# Patient Record
Sex: Female | Born: 1960 | Race: White | Hispanic: No | Smoking: Never smoker
Health system: Southern US, Community
[De-identification: ages and names within clinical notes are randomized; demographics above are authoritative.]

## PROBLEM LIST (undated history)

## (undated) DIAGNOSIS — M1990 Unspecified osteoarthritis, unspecified site: Secondary | ICD-10-CM

## (undated) DIAGNOSIS — E785 Hyperlipidemia, unspecified: Secondary | ICD-10-CM

## (undated) DIAGNOSIS — T7840XA Allergy, unspecified, initial encounter: Secondary | ICD-10-CM

## (undated) DIAGNOSIS — M545 Low back pain, unspecified: Secondary | ICD-10-CM

## (undated) DIAGNOSIS — G20A1 Parkinson's disease without dyskinesia, without mention of fluctuations: Secondary | ICD-10-CM

## (undated) DIAGNOSIS — D219 Benign neoplasm of connective and other soft tissue, unspecified: Secondary | ICD-10-CM

## (undated) HISTORY — DX: Benign neoplasm of connective and other soft tissue, unspecified: D21.9

## (undated) HISTORY — PX: WISDOM TOOTH EXTRACTION: SHX21

## (undated) HISTORY — PX: CHOLECYSTECTOMY: SHX55

## (undated) HISTORY — PX: UTERINE FIBROID SURGERY: SHX826

## (undated) HISTORY — DX: Low back pain, unspecified: M54.50

## (undated) HISTORY — DX: Allergy, unspecified, initial encounter: T78.40XA

## (undated) HISTORY — PX: COLONOSCOPY: SHX174

## (undated) HISTORY — PX: ROTATOR CUFF REPAIR: SHX139

## (undated) HISTORY — DX: Parkinson's disease without dyskinesia, without mention of fluctuations: G20.A1

## (undated) HISTORY — DX: Hyperlipidemia, unspecified: E78.5

## (undated) HISTORY — DX: Unspecified osteoarthritis, unspecified site: M19.90

---

## 1996-04-20 HISTORY — PX: CHOLECYSTECTOMY: SHX55

## 2001-08-17 ENCOUNTER — Other Ambulatory Visit: Admission: RE | Admit: 2001-08-17 | Discharge: 2001-08-17 | Payer: Self-pay | Admitting: Obstetrics and Gynecology

## 2002-08-25 ENCOUNTER — Other Ambulatory Visit: Admission: RE | Admit: 2002-08-25 | Discharge: 2002-08-25 | Payer: Self-pay | Admitting: Obstetrics and Gynecology

## 2003-08-30 ENCOUNTER — Other Ambulatory Visit: Admission: RE | Admit: 2003-08-30 | Discharge: 2003-08-30 | Payer: Self-pay | Admitting: Obstetrics and Gynecology

## 2004-04-04 ENCOUNTER — Ambulatory Visit (HOSPITAL_COMMUNITY): Admission: RE | Admit: 2004-04-04 | Discharge: 2004-04-04 | Payer: Self-pay | Admitting: Obstetrics and Gynecology

## 2004-04-04 ENCOUNTER — Encounter (INDEPENDENT_AMBULATORY_CARE_PROVIDER_SITE_OTHER): Payer: Self-pay | Admitting: *Deleted

## 2004-05-20 ENCOUNTER — Ambulatory Visit (HOSPITAL_COMMUNITY): Admission: RE | Admit: 2004-05-20 | Discharge: 2004-05-20 | Payer: Self-pay | Admitting: Orthopedic Surgery

## 2004-09-10 ENCOUNTER — Other Ambulatory Visit: Admission: RE | Admit: 2004-09-10 | Discharge: 2004-09-10 | Payer: Self-pay | Admitting: Obstetrics and Gynecology

## 2005-09-17 ENCOUNTER — Other Ambulatory Visit: Admission: RE | Admit: 2005-09-17 | Discharge: 2005-09-17 | Payer: Self-pay | Admitting: Obstetrics and Gynecology

## 2006-04-20 HISTORY — PX: NOVASURE ABLATION: SHX5394

## 2007-01-19 ENCOUNTER — Encounter (INDEPENDENT_AMBULATORY_CARE_PROVIDER_SITE_OTHER): Payer: Self-pay | Admitting: Obstetrics and Gynecology

## 2007-01-19 ENCOUNTER — Ambulatory Visit (HOSPITAL_COMMUNITY): Admission: RE | Admit: 2007-01-19 | Discharge: 2007-01-19 | Payer: Self-pay | Admitting: Obstetrics and Gynecology

## 2009-04-20 HISTORY — PX: POLYPECTOMY: SHX149

## 2009-06-28 ENCOUNTER — Ambulatory Visit (HOSPITAL_COMMUNITY): Admission: RE | Admit: 2009-06-28 | Discharge: 2009-06-28 | Payer: Self-pay | Admitting: Obstetrics and Gynecology

## 2010-04-20 HISTORY — PX: NOVASURE ABLATION: SHX5394

## 2010-04-20 HISTORY — PX: UTERINE FIBROID SURGERY: SHX826

## 2010-07-13 LAB — CBC
HCT: 37.6 % (ref 36.0–46.0)
MCHC: 33.7 g/dL (ref 30.0–36.0)
Platelets: 256 10*3/uL (ref 150–400)
RDW: 12.2 % (ref 11.5–15.5)

## 2010-09-02 NOTE — Op Note (Signed)
Martha Phillips, Martha Phillips                ACCOUNT NO.:  [id number]   MEDICAL RECORD NO.:  [id number]          PATIENT TYPE:  AMB   LOCATION:  SDC                           FACILITY:  [facility]   PHYSICIAN:  Dois Davenport A. Rivard, M.D. DATE OF BIRTH:  05-28-1960   DATE OF PROCEDURE:  01/19/2007  DATE OF DISCHARGE:                               OPERATIVE REPORT   PREOPERATIVE DIAGNOSIS:  Dysfunctional uterine bleeding with possible  submucosal fibroid.   POSTOPERATIVE DIAGNOSIS:  Dysfunctional uterine bleeding with  endometrial polyp.   ANESTHESIA:  IV sedation and paracervical block.   PROCEDURE:  1. Hysteroscopy.  2. D&C.  3. Novasure endometrial ablation.   SURGEON:  Dr. Dois Davenport A. Rivard, M.D.   ASSISTANT:  None.   Estimated.   BLOOD LOSS:  Minimal.   PROCEDURE:  After being informed of the planned procedure with possible  complications including bleeding, infection and injury to uterus and  possibly subsequent intra-abdominal organ, informed consent is obtained.  The patient is taken to OR #8, given IV sedation, placed in the  lithotomy position and prepped and draped in a sterile fashion.  Her  bladder is emptied with an in-and-out Foley catheter.   GYN exam reveals an anteverted uterus, normal in size and shape, mobile  and 2 normal adnexa.  A weighted speculum is inserted.  Anterior lip of  the cervix was grasped with a tenaculum forceps, and we perform a  paracervical block using lidocaine 1% 16 mL in the usual fashion.  Uterus is sounded at 8 cm; cervical length is 3 cm.  The cervix was then  dilated using Hegar dilator until #29, which allows easy entry of a  diagnostic hysteroscope.  With LR infusion at a maximum pressure of 90  mmHg, we are able to visualize the endometrial cavity and 2 tubal ostia.  We note a left cornual polyp measuring 0.5 cm, and a posterior lower  uterine segment polyp measuring about the same.   The diagnostic hysteroscope was then removed and the  Novasure is  inserted easily to the fundus.  It is deployed and the cavity width is  measured at 3.3 cm.  After assessing integrity of the cavity, which was  passed, we fired the Novasure for endometrial ablation at a power of 91  for a 1-minute procedure time.  The Novasure is then retracted and  removed.     Please before inserting the Novasure, we proceed with a sharp  curettage of the endometrial cavity, removing a moderate amount of  normal-appearing endometrium with the polyps.   After the endometrial ablation is completed, we reinserted the  diagnostic hysteroscope, and note a completely blanched endometrial  cavity.  All instruments are then removed.  Instruments and sponge count  is complete x2.  Estimated blood loss is minimal.  Water deficit is 200  mL.   The cervix has a small laceration on its anterior lip, which was  repaired with a figure-of-eight stitch of 2-0 chromic.   Instrument and sponge count is complete x2.  The procedure is very well  tolerated by the patient,  who is taken to recovery room and discharged  home in a well and stable condition.   Specimen: endometrial curettings sent to pathology   Silverio Lay, MD      Crist Fat. Rivard, M.D.  Electronically Signed     SAR/MEDQ  D:  01/19/2007  T:  01/19/2007  Job:  [id number]

## 2010-09-05 NOTE — Op Note (Signed)
Martha Phillips, Martha Phillips                ACCOUNT NO.:  [id number]   MEDICAL RECORD NO.:  [id number]          PATIENT TYPE:  AMB   LOCATION:  SDC                           FACILITY:  [facility]   PHYSICIAN:  Osborn Coho, M.D.   DATE OF BIRTH:  1961/01/04   DATE OF PROCEDURE:  04/04/2004  DATE OF DISCHARGE:                                 OPERATIVE REPORT   PREOPERATIVE DIAGNOSES:  1.  Metrorrhagia.  2.  Posterior submucosal fibroid.   POSTOPERATIVE DIAGNOSES:  1.  Metrorrhagia.  2.  Posterior submucosal fibroid.   PROCEDURE:  Hysteroscopy, D&C, resection of fibroid.   ANESTHESIA:  General.   ATTENDING PHYSICIAN:  Osborn Coho, M.D.   FLUIDS:  1500 mL   Hysteroscopic fluid deficit  520 mL of sorbitol.   ESTIMATED BLOOD LOSS:  Minimal.   URINE OUTPUT:  Straight cath prior to procedure.   COMPLICATIONS:  None.   FINDINGS:  Large posterior submucosal fibroid.   PATHOLOGY:  Portions of resected fibroid and endometrial curettings.   DESCRIPTION OF PROCEDURE:  The patient was taken to the operating room after  consent signed and witnessed. The patient was placed under general  anesthesia and prepped and draped in the normal sterile fashion in the  dorsal lithotomy position. A bivalve speculum was placed in the patient's  vagina and a paracervical block administered with a total of 10 mL of 1%  lidocaine. A single tooth tenaculum was placed on the anterior lip of the  cervix and the cervix dilated for passage of the diagnostic hysteroscope.  The diagnostic hysteroscope was introduced and large posterior submucosal  fibroid noted.  The cervix was then dilated for passage of the resectoscope.  The  resectoscope was introduced and resection of fibroid performed. Curettage  was performed after resection complete.  Instruments were removed. Sponge  and lap count were correct.  The tenaculum was removed and there was  hemostasis noted at the tenaculum site. There was minimal bleeding  noted.     [name]   AR/MEDQ  D:  04/04/2004  T:  04/05/2004  Job:  [id number]

## 2011-01-29 LAB — CBC
MCHC: 34.3
MCV: 89.6
Platelets: 349
RBC: 4.43
RDW: 12.4

## 2011-01-29 LAB — PREGNANCY, URINE: Preg Test, Ur: NEGATIVE

## 2011-09-21 ENCOUNTER — Encounter: Payer: Self-pay | Admitting: Internal Medicine

## 2011-09-29 ENCOUNTER — Encounter: Payer: Self-pay | Admitting: Obstetrics and Gynecology

## 2011-09-29 ENCOUNTER — Ambulatory Visit: Payer: Self-pay | Admitting: Obstetrics and Gynecology

## 2011-09-29 ENCOUNTER — Ambulatory Visit (INDEPENDENT_AMBULATORY_CARE_PROVIDER_SITE_OTHER): Payer: 59 | Admitting: Obstetrics and Gynecology

## 2011-09-29 VITALS — BP 112/74 | Ht 65.6 in | Wt 186.0 lb

## 2011-09-29 DIAGNOSIS — Z780 Asymptomatic menopausal state: Secondary | ICD-10-CM

## 2011-09-29 DIAGNOSIS — Z01419 Encounter for gynecological examination (general) (routine) without abnormal findings: Secondary | ICD-10-CM

## 2011-09-29 DIAGNOSIS — Z124 Encounter for screening for malignant neoplasm of cervix: Secondary | ICD-10-CM

## 2011-09-29 NOTE — Progress Notes (Signed)
The patient reports:normal menses, no abnormal bleeding, pelvic pain or discharge  Contraception:oral contraceptives (estrogen/progesterone)lo- estrin 24   Last mammogram: No results  Yet  September 25 2011 Last pap: was normal June  2012  GC/Chlamydia cultures offered: declined HIV/RPR/HbsAg offered:  declined HSV 1 and 2 glycoprotein offered: declined  Menstrual cycle regular and monthly: No: Birth Control Menstrual flow normal: No: Birth control  Urinary symptoms: none Normal bowel movements: Yes Reports abuse at home:No  Subjective:    Martha Phillips is a 51 y.o. female, G0P0, who presents for an annual exam. Loestrin 24 continuous: amenorrhea s/p ablation.   History   Social History  . Marital Status: Divorced    Spouse Name: N/A    Number of Children: N/A  . Years of Education: N/A   Social History Main Topics  . Smoking status: Never Smoker   . Smokeless tobacco: Never Used  . Alcohol Use: No  . Drug Use: No  . Sexually Active: No     Loestrin 24 cont.  cycle control   Other Topics Concern  . None   Social History Narrative  . None    Menstrual cycle:   LMP: No LMP recorded. Patient is not currently having periods (Reason: Oral contraceptives).           Cycle:none  The following portions of the patient's history were reviewed and updated as appropriate: allergies, current medications, past family history, past medical history, past social history, past surgical history and problem list.  Review of Systems Pertinent items are noted in HPI. Breast:Negative for breast lump,nipple discharge or nipple retraction Gastrointestinal: Negative for abdominal pain, change in bowel habits or rectal bleeding Urinary:negative   Objective:    BP 112/74  Ht 5' 5.6" (1.666 m)  Wt 186 lb (84.369 kg)  BMI 30.39 kg/m2    Weight:  Wt Readings from Last 1 Encounters:  09/29/11 186 lb (84.369 kg)          BMI: Body mass index is 30.39 kg/(m^2).  General Appearance: Alert,  appropriate appearance for age. No acute distress HEENT: Grossly normal Neck / Thyroid: Supple, no masses, nodes or enlargement Lungs: clear to auscultation bilaterally Back: No CVA tenderness Breast Exam: No masses or nodes.No dimpling, nipple retraction or discharge. Cardiovascular: Regular rate and rhythm. S1, S2, no murmur Gastrointestinal: Soft, non-tender, no masses or organomegaly Pelvic Exam: Vulva and vagina appear normal. Bimanual exam reveals normal uterus and adnexa. Rectovaginal: normal rectal, no masses Lymphatic Exam: Non-palpable nodes in neck, clavicular, axillary, or inguinal regions Skin: no rash or abnormalities Neurologic: Normal gait and speech, no tremor  Psychiatric: Alert and oriented, appropriate affect.   Wet Prep:not applicable Urinalysis:not applicable UPT: Not done   Assessment:    Normal gyn exam    Plan:    return annually or prn STD screening: declined Contraception:oral contraceptives (estrogen/progesterone)      Stacie Templin AMD

## 2011-09-30 LAB — PAP IG W/ RFLX HPV ASCU

## 2011-10-01 ENCOUNTER — Telehealth: Payer: Self-pay | Admitting: Obstetrics and Gynecology

## 2011-10-01 NOTE — Telephone Encounter (Signed)
Laura/epic °

## 2011-10-16 ENCOUNTER — Other Ambulatory Visit: Payer: 59

## 2011-10-16 DIAGNOSIS — N951 Menopausal and female climacteric states: Secondary | ICD-10-CM

## 2011-10-17 LAB — PROLACTIN: Prolactin: 7.5 ng/mL

## 2011-10-27 ENCOUNTER — Other Ambulatory Visit: Payer: Self-pay | Admitting: Obstetrics and Gynecology

## 2011-10-27 NOTE — Telephone Encounter (Signed)
sr pt 

## 2011-11-06 ENCOUNTER — Encounter: Payer: Self-pay | Admitting: Internal Medicine

## 2011-11-06 ENCOUNTER — Ambulatory Visit (AMBULATORY_SURGERY_CENTER): Payer: 59 | Admitting: *Deleted

## 2011-11-06 VITALS — Ht 66.0 in | Wt 192.0 lb

## 2011-11-06 DIAGNOSIS — Z1211 Encounter for screening for malignant neoplasm of colon: Secondary | ICD-10-CM

## 2011-11-06 MED ORDER — MOVIPREP 100 G PO SOLR: ORAL | Status: DC

## 2011-11-10 ENCOUNTER — Other Ambulatory Visit: Payer: Self-pay

## 2011-11-10 ENCOUNTER — Other Ambulatory Visit: Payer: Self-pay | Admitting: Obstetrics and Gynecology

## 2011-11-10 ENCOUNTER — Telehealth: Payer: Self-pay | Admitting: Obstetrics and Gynecology

## 2011-11-10 NOTE — Telephone Encounter (Signed)
Approved rx refill for pt. Called into [facility] loestrine 24 Dips 3 packs sig 1 po qd with 1 year refill. Pt aware of rx refill and voice understanding. bt cma

## 2011-11-20 ENCOUNTER — Encounter: Payer: Self-pay | Admitting: Internal Medicine

## 2011-11-20 ENCOUNTER — Ambulatory Visit (AMBULATORY_SURGERY_CENTER): Payer: 59 | Admitting: Internal Medicine

## 2011-11-20 VITALS — BP 124/75 | HR 63 | Temp 99.2°F | Resp 14 | Ht 66.0 in | Wt 192.0 lb

## 2011-11-20 DIAGNOSIS — Z1211 Encounter for screening for malignant neoplasm of colon: Secondary | ICD-10-CM

## 2011-11-20 MED ORDER — SODIUM CHLORIDE 0.9 % IV SOLN: 500.0000 mL | INTRAVENOUS | Status: DC

## 2011-11-20 NOTE — Op Note (Signed)
[facility] [address] [place], [place]  [address]  COLONOSCOPY PROCEDURE REPORT  PATIENT:  Martha Phillips, Martha Phillips  MR#:  [id number] BIRTHDATE:  1960-12-09, 51 yrs. old  GENDER:  female ENDOSCOPIST:  Hedwig Morton. Juanda Chance, MD REF. BY:  Artis Delay, M.D. PROCEDURE DATE:  11/20/2011 PROCEDURE:  Colonoscopy 19147 ASA CLASS:  Class I INDICATIONS:  colorectal cancer screening, average risk MEDICATIONS:   MAC sedation, administered by CRNA, propofol (Diprivan) 350 mg  DESCRIPTION OF PROCEDURE:   After the risks and benefits and of the procedure were explained, informed consent was obtained. Digital rectal exam was performed and revealed no rectal masses. The LB [id number]-[id number] endoscope was introduced through the anus and advanced to the cecum, which was identified by both the appendix and ileocecal valve.  The quality of the prep was excellent, using MoviPrep.  The instrument was then slowly withdrawn as the colon was fully examined. <<PROCEDUREIMAGES>>  FINDINGS:  No polyps or cancers were seen (see image1, image2, image3, and image4).   Retroflexed views in the rectum revealed no abnormalities.    The scope was then withdrawn from the patient and the procedure completed.  COMPLICATIONS:  None ENDOSCOPIC IMPRESSION: 1) No polyps or cancers 2) Normal colonoscopy RECOMMENDATIONS: 1) High fiber diet.  REPEAT EXAM:  In 10 year(s) for.  ______________________________ Hedwig Morton. Juanda Chance, MD  CC:  n. eSIGNED:   Hedwig Morton. Lourene Hoston at 11/20/2011 09:25 AM  Gypsy Balsam, [phone]

## 2011-11-20 NOTE — Progress Notes (Signed)
Patient did not experience any of the following events: a burn prior to discharge; a fall within the facility; wrong site/side/patient/procedure/implant event; or a hospital transfer or hospital admission upon discharge from the facility. (G8907) Patient did not have preoperative order for IV antibiotic SSI prophylaxis. (G8918) Patient did not have preoperative order for IV antibiotic SSI prophylaxis. (G8918)  

## 2011-11-20 NOTE — Patient Instructions (Addendum)

## 2011-11-23 ENCOUNTER — Telehealth: Payer: Self-pay

## 2011-11-23 NOTE — Telephone Encounter (Signed)
  Follow up Call-  Call back number 11/20/2011  Post procedure Call Back phone  # [phone]  Permission to leave phone message No  comments no voicemail     Patient questions:  Do you have a fever, pain , or abdominal swelling? no Pain Score  0 *  Have you tolerated food without any problems? yes  Have you been able to return to your normal activities? yes  Do you have any questions about your discharge instructions: Diet   no Medications  no Follow up visit  no  Do you have questions or concerns about your Care? no  Actions: * If pain score is 4 or above: No action needed, pain <4.

## 2012-06-11 ENCOUNTER — Other Ambulatory Visit: Payer: Self-pay | Admitting: Obstetrics and Gynecology

## 2013-01-28 ENCOUNTER — Emergency Department (INDEPENDENT_AMBULATORY_CARE_PROVIDER_SITE_OTHER)
Admission: EM | Admit: 2013-01-28 | Discharge: 2013-01-28 | Disposition: A | Discharge status: Home / Self Care | Payer: 59 | Source: Home / Self Care | Attending: Family Medicine | Admitting: Family Medicine

## 2013-01-28 ENCOUNTER — Encounter (HOSPITAL_COMMUNITY): Payer: Self-pay | Admitting: Emergency Medicine

## 2013-01-28 DIAGNOSIS — S61209A Unspecified open wound of unspecified finger without damage to nail, initial encounter: Secondary | ICD-10-CM

## 2013-01-28 DIAGNOSIS — S61012A Laceration without foreign body of left thumb without damage to nail, initial encounter: Secondary | ICD-10-CM

## 2013-01-28 NOTE — ED Notes (Signed)
C/o left hand laceration around the thumb area which happened this morning as patient was cutting vegetables.

## 2013-01-28 NOTE — ED Provider Notes (Signed)
CSN: [id number]     Arrival date & time 01/28/13  4098 History   First MD Initiated Contact with Patient 01/28/13 [phone]     Chief Complaint  Patient presents with  . Laceration   (Consider location/radiation/quality/duration/timing/severity/associated sxs/prior Treatment) Patient is a 52 y.o. female presenting with skin laceration. The history is provided by the patient.  Laceration Location:  Finger Finger laceration location:  L thumb Length (cm):  Approximately 3.5 cm Depth:  Through underlying tissue Quality: straight   Bleeding: controlled   Time since incident:  3 hours Laceration mechanism:  Knife Pain details:    Quality:  Dull   Severity:  Mild Foreign body present:  No foreign bodies Tetanus status:  Up to date (2 years ago.)  The patient presents today with a laceration to proximal inner aspect of L thumb.  The patient states that she was cutting vegetables with a wet knife and the knife slipped and cut her.    Past Medical History  Diagnosis Date  . Fibroid   . Allergy    Past Surgical History  Procedure Laterality Date  . Cholecystectomy    . Novasure ablation  2008  . [place]ine [place]  [place]  [place]    submucosoll resected  . Rotator cuff repair      right  . Polypectomy  2011    Uterus with ablation  . Wisdom tooth extraction     Family History  Problem Relation Age of Onset  . Cancer Sister 5    uterine @ 40 and cervical @30   . Colon cancer Neg Hx   . Stomach cancer Neg Hx    History  Substance Use Topics  . Smoking status: Never Smoker   . Smokeless tobacco: Never Used  . Alcohol Use: No   OB History   Grav Para Term Preterm Abortions TAB SAB Ect Mult Living   0              Review of Systems  Constitutional: Negative.   HENT: Negative.   Eyes: Negative.   Respiratory: Negative.   Cardiovascular: Negative.   Gastrointestinal: Negative.   Endocrine: Negative.   Genitourinary: Negative.   Musculoskeletal: Negative.   Skin:  Positive for wound.  Allergic/Immunologic: Negative.   Neurological: Negative.   Hematological: Negative.   Psychiatric/Behavioral: Negative.     Allergies  Penicillins and Latex  Home Medications   Current Outpatient Rx  Name  Route  Sig  Dispense  Refill  . aspirin (BABY ASPIRIN) 81 MG chewable tablet   Oral   Chew 81 mg by mouth daily.         . cetirizine (ZYRTEC) 10 MG tablet   Oral   Take 10 mg by mouth daily.         . Coenzyme Q10 (CO Q 10) 10 MG CAPS   Oral   Take by mouth.         . LOESTRIN 24 FE 1-20 MG-MCG tablet      TAKE 1 TABLET BY MOUTH DAILY CONTINUOUS   84 tablet   3   . Multiple Vitamin (MULTIVITAMIN) capsule   Oral   Take 1 capsule by mouth daily.         . simvastatin (ZOCOR) 10 MG tablet   Oral   Take 10 mg by mouth at bedtime.          BP 150/82  Pulse 80  Temp(Src) 98.1 F (36.7 C) (Oral)  Resp 16  SpO2  100%  Physical Exam  Nursing note and vitals reviewed. Constitutional: She is oriented to person, place, and time. She appears well-developed and well-nourished. No distress.  Cardiovascular: Normal rate, regular rhythm, normal heart sounds and intact distal pulses.  Exam reveals no gallop and no friction rub.   No murmur heard. Pulmonary/Chest: Effort normal and breath sounds normal. No respiratory distress. She has no wheezes. She has no rales. She exhibits no tenderness.  Musculoskeletal: Normal range of motion. She exhibits no edema and no tenderness.       Left hand: Normal sensation noted.       Hands: Neurological: She is alert and oriented to person, place, and time.  Skin: Skin is warm and dry. No rash noted. She is not diaphoretic. No erythema. No pallor.   Color movement and sensation intact to all distal phalanges.  Patient has full movement and range of motion of the left thumb.   ED Course   LACERATION REPAIR Date/Time: 01/28/2013 10:49 AM Performed by: Weber Cooks Authorized by: Bradd Canary  D Consent: Verbal consent obtained. Risks and benefits: risks, benefits and alternatives were discussed Consent given by: patient Patient understanding: patient states understanding of the procedure being performed Patient identity confirmed: verbally with patient Laceration length: 3.5 cm Foreign bodies: no foreign bodies Tendon involvement: none Nerve involvement: none Vascular damage: no Anesthesia: local infiltration Local anesthetic: lidocaine 2% with epinephrine Anesthetic total: 4 ml Preparation: Patient was prepped and draped in the usual sterile fashion. Irrigation solution: saline Amount of cleaning: standard Skin closure: Ethilon Number of sutures: 3 Technique: horizontal mattress (and 1 simple ) Approximation: close Dressing: 4x4 sterile gauze and antibiotic ointment Patient tolerance: Patient tolerated the procedure well with no immediate complications. Comments: Repair discussed with Dr. Artis Flock prior to closure.  Patient reported good anesthesia during procedure. Easier irrigation of the wound no foreign body or deep structure injury noted.   (including critical care time) Wound edges are well approximated and everted. Labs Review Labs Reviewed - No data to display Imaging Review No results found.   MDM   1. Thumb laceration, left, initial encounter    Patient to return in 7-10 days for removal of sutures. Dressing in place for next 2 days. Patient to remove after that time once granulation has begun. Patient verbalizes understanding of plan of care.    Weber Cooks, NP 01/28/13 1102

## 2013-01-29 NOTE — ED Provider Notes (Signed)
Medical screening examination/treatment/procedure(s) were performed by resident physician or non-physician practitioner and as supervising physician I was immediately available for consultation/collaboration.   KINDL,JAMES DOUGLAS MD.   James D Kindl, MD 01/29/13 1238 

## 2013-02-05 ENCOUNTER — Encounter (HOSPITAL_COMMUNITY): Payer: Self-pay | Admitting: Emergency Medicine

## 2013-02-05 ENCOUNTER — Emergency Department (INDEPENDENT_AMBULATORY_CARE_PROVIDER_SITE_OTHER)
Admission: EM | Admit: 2013-02-05 | Discharge: 2013-02-05 | Disposition: A | Discharge status: Home / Self Care | Payer: 59 | Source: Home / Self Care | Attending: Emergency Medicine | Admitting: Emergency Medicine

## 2013-02-05 DIAGNOSIS — T148XXA Other injury of unspecified body region, initial encounter: Secondary | ICD-10-CM

## 2013-02-05 DIAGNOSIS — IMO0002 Reserved for concepts with insufficient information to code with codable children: Secondary | ICD-10-CM

## 2013-02-05 NOTE — ED Provider Notes (Signed)
Chief Complaint:   Chief Complaint  Patient presents with  . Suture / Staple Removal    History of Present Illness:   Martha Phillips is a 52 year old female who lacerated the web space between her thumb and index finger of her left hand 9 days ago. This was sutured and she returns today for suture removal. She's been taking good care of it and appears to be healing well. There is no evidence of infection.  Review of Systems:  Other than noted above, the patient denies any of the following symptoms: Systemic:  No fever or chills. Musculoskeletal:  No joint pain or decreased range of motion. Neuro:  No numbness, tingling, or weakness.  PMFSH:  Past medical history, family history, social history, meds, and allergies were reviewed.   Physical Exam:   Vital signs:  BP 150/85  Pulse 75  Temp(Src) 98.4 F (36.9 C) (Oral)  Resp 16  SpO2 98% Ext:  There is a 2 cm laceration in the webspace between the thumb and index finger the left hand which has been closed with 4 sutures. This is well-healed with no evidence of infection.  All other joints had a full ROM without pain.  Pulses were full.  Good capillary refill in all digits.  No edema. Neurological:  Alert and oriented.  No muscle weakness.  Sensation was intact to light touch.   Procedure: Verbal informed consent was obtained.  The patient was informed of the risks and benefits of the procedure and understands and accepts.  Identity of the patient was verified verbally and by wristband. The wound was prepped with alcohol and the sutures were snipped out. Patient tolerated this procedure well. She was instructed in wound care.  Assessment:  The encounter diagnosis was Laceration.  Plan:   1.  Meds:  The following meds were prescribed:   Discharge Medication List as of 02/05/2013  9:35 AM      2.  Patient Education/Counseling:  The patient was given appropriate handouts, self care instructions, and instructed in symptomatic relief.  Instructions were given for wound care.   3.  Follow up:  The patient was told to follow up immediately if there is any sign of infection.   Reuben Likes, MD 02/05/13 [phone]

## 2013-04-20 HISTORY — PX: POLYPECTOMY: SHX149

## 2013-06-27 ENCOUNTER — Other Ambulatory Visit: Payer: Self-pay | Admitting: Orthopedic Surgery

## 2013-06-27 DIAGNOSIS — M79659 Pain in unspecified thigh: Secondary | ICD-10-CM

## 2013-06-27 DIAGNOSIS — R102 Pelvic and perineal pain: Secondary | ICD-10-CM

## 2013-06-30 ENCOUNTER — Other Ambulatory Visit: Payer: 59

## 2013-07-06 ENCOUNTER — Ambulatory Visit
Admission: RE | Admit: 2013-07-06 | Discharge: 2013-07-06 | Disposition: A | Discharge status: Home / Self Care | Payer: 59 | Source: Ambulatory Visit | Attending: Orthopedic Surgery | Admitting: Orthopedic Surgery

## 2013-07-06 DIAGNOSIS — R102 Pelvic and perineal pain: Secondary | ICD-10-CM

## 2013-07-06 DIAGNOSIS — M79659 Pain in unspecified thigh: Secondary | ICD-10-CM

## 2013-07-06 MED ORDER — GADOBENATE DIMEGLUMINE 529 MG/ML IV SOLN
18.0000 mL | Freq: Once | INTRAVENOUS | Status: AC | PRN
  Administered 2013-07-06: 18 mL via INTRAVENOUS

## 2013-12-19 ENCOUNTER — Other Ambulatory Visit: Payer: Self-pay | Admitting: Dermatology

## 2014-09-27 ENCOUNTER — Emergency Department (INDEPENDENT_AMBULATORY_CARE_PROVIDER_SITE_OTHER): Payer: 59

## 2014-09-27 ENCOUNTER — Emergency Department (HOSPITAL_COMMUNITY)
Admission: EM | Admit: 2014-09-27 | Discharge: 2014-09-27 | Disposition: A | Discharge status: Home / Self Care | Payer: 59 | Source: Home / Self Care | Attending: Family Medicine | Admitting: Family Medicine

## 2014-09-27 ENCOUNTER — Encounter (HOSPITAL_COMMUNITY): Payer: Self-pay

## 2014-09-27 DIAGNOSIS — S61219A Laceration without foreign body of unspecified finger without damage to nail, initial encounter: Secondary | ICD-10-CM | POA: Diagnosis not present

## 2014-09-27 DIAGNOSIS — S62609B Fracture of unspecified phalanx of unspecified finger, initial encounter for open fracture: Secondary | ICD-10-CM | POA: Diagnosis not present

## 2014-09-27 MED ORDER — HYDROCODONE-ACETAMINOPHEN 5-325 MG PO TABS: 1.0000 | ORAL_TABLET | ORAL | Status: DC | PRN

## 2014-09-27 MED ORDER — SULFAMETHOXAZOLE-TRIMETHOPRIM 800-160 MG PO TABS: 2.0000 | ORAL_TABLET | Freq: Two times a day (BID) | ORAL | Status: DC

## 2014-09-27 NOTE — ED Provider Notes (Signed)
CSN: [id number]     Arrival date & time 09/27/14  1807 History   First MD Initiated Contact with Patient 09/27/14 1901     Chief Complaint  Patient presents with  . 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   Past Medical History  Diagnosis Date  . Fibroid   . Allergy    Past Surgical History  Procedure Laterality Date  . Cholecystectomy    . Novasure ablation  2008  . Uterine fibroid [place]  [place]  [place]    submucosoll resected  . Rotator cuff repair      right  . Polypectomy  2011    Uterus with ablation  . Wisdom tooth extraction     Family History  Problem Relation Age of Onset  . Cancer Sister 30    uterine @ 3 and cervical @30   . Colon cancer Neg Hx   . Stomach cancer Neg Hx    History  Substance Use Topics  . Smoking status: Never Smoker   . Smokeless tobacco: Never Used  . Alcohol Use: No   OB History    Gravida Para Term Preterm AB TAB SAB Ectopic Multiple Living   0              Review of Systems  Constitutional: Negative.   Respiratory: Negative.   Musculoskeletal:       As per history of present illness  Neurological: Negative for dizziness.    Allergies  Penicillins and Latex  Home Medications   Prior to Admission medications   Medication Sig Start Date End Date Taking? Authorizing Provider  escitalopram (LEXAPRO) 10 MG tablet Take 10 mg by mouth daily.   Yes Historical Provider, MD  aspirin (BABY ASPIRIN) 81 MG chewable tablet Chew 81 mg by mouth daily.    Historical Provider, MD  cetirizine (ZYRTEC) 10 MG tablet Take 10 mg by mouth daily.    Historical Provider, MD  Coenzyme Q10 (CO Q 10) 10 MG CAPS Take by mouth.    Historical  Provider, MD  HYDROcodone-acetaminophen (NORCO/VICODIN) 5-325 MG per tablet Take 1 tablet by mouth every 4 (four) hours as needed. 09/27/14   Janne Napoleon, NP  LOESTRIN 24 FE 1-20 MG-MCG tablet TAKE 1 TABLET BY MOUTH DAILY CONTINUOUS 10/27/11   Delsa Bern, MD  Multiple Vitamin (MULTIVITAMIN) capsule Take 1 capsule by mouth daily.    Historical Provider, MD  simvastatin (ZOCOR) 10 MG tablet Take 10 mg by mouth at bedtime.    Historical Provider, MD  sulfamethoxazole-trimethoprim (BACTRIM DS,SEPTRA DS) 800-160 MG per tablet Take 2 tablets by mouth 2 (two) times daily. 09/27/14   Gregor Hams, MD   BP 154/83 mmHg  Pulse 85  Resp 14  SpO2 100% Physical Exam  Constitutional: She is oriented to person, place, and time. She appears well-developed and well-nourished. No distress.  Neck: Normal range of motion. Neck supple.  Cardiovascular: Normal rate.   Pulmonary/Chest: Effort normal. No respiratory distress.  Musculoskeletal:  As per history of present illness. There is a amputation of the left middle finger involving the distal phalanx and the distal fourth of the nail and the tuft of the distal phalanx. No joint affected.   Neurological: She is alert and oriented to  person, place, and time.  Skin: Skin is warm and dry.  Nursing note and vitals reviewed.   ED Course  Procedures (including critical care time) Labs Review Labs Reviewed - No data to display  Imaging Review Dg Finger Middle Left  09/27/2014   CLINICAL DATA:  Stuck middle finger in a lawnmower blade.  EXAM: LEFT MIDDLE FINGER 2+V  COMPARISON:  None.  FINDINGS: There is traumatic amputation of the distal aspect of the third finger. Comminution of the distal phalanx tuft. Soft tissue avulsion. No metallic radiopaque foreign body.  IMPRESSION: Injury to the distal third finger as described.   Electronically Signed   By: Rolla Flatten M.D.   On: 09/27/2014 18:47     MDM   1. Finger laceration, initial encounter   2. Finger  fracture, left, open, initial encounter    Called/page Dr. Lenon Curt [phone]. [phone]. No response by [id number]. Dr. Georgina Snell spoke Dr. Fidela Juneau and arranged F/U tomorrow at 10: 00 AM in the office Septra DS Norco 5 mg #15. Wound cleaning and irrigation. Adaptic, sterile dressing.  Janne Napoleon, NP 09/27/14 2106  Janne Napoleon, NP 09/27/14 2107

## 2014-09-27 NOTE — ED Notes (Addendum)
Reportedly earlier today, reached under lawnmower w left hand, laceration to tip of long finger . Bleeding controlled at present, denies other injury

## 2014-09-27 NOTE — Discharge Instructions (Signed)
Thank you for coming in today. Follow up with Dr. Fredna Dow.  Do not eat or drink anything after midnight.   Laceration Care, Adult A laceration is a cut or lesion that goes through all layers of the skin and into the tissue just beneath the skin. TREATMENT  Some lacerations may not require closure. Some lacerations may not be able to be closed due to an increased risk of infection. It is important to see your caregiver as soon as possible after an injury to minimize the risk of infection and maximize the opportunity for successful closure. If closure is appropriate, pain medicines may be given, if needed. The wound will be cleaned to help prevent infection. Your caregiver will use stitches (sutures), staples, wound glue (adhesive), or skin adhesive strips to repair the laceration. These tools bring the skin edges together to allow for faster healing and a better cosmetic outcome. However, all wounds will heal with a scar. Once the wound has healed, scarring can be minimized by covering the wound with sunscreen during the day for 1 full year. HOME CARE INSTRUCTIONS  For sutures or staples:  Keep the wound clean and dry.  If you were given a bandage (dressing), you should change it at least once a day. Also, change the dressing if it becomes wet or dirty, or as directed by your caregiver.  Wash the wound with soap and water 2 times a day. Rinse the wound off with water to remove all soap. Pat the wound dry with a clean towel.  After cleaning, apply a thin layer of the antibiotic ointment as recommended by your caregiver. This will help prevent infection and keep the dressing from sticking.  You may shower as usual after the first 24 hours. Do not soak the wound in water until the sutures are removed.  Only take over-the-counter or prescription medicines for pain, discomfort, or fever as directed by your caregiver.  Get your sutures or staples removed as directed by your caregiver. For skin  adhesive strips:  Keep the wound clean and dry.  Do not get the skin adhesive strips wet. You may bathe carefully, using caution to keep the wound dry.  If the wound gets wet, pat it dry with a clean towel.  Skin adhesive strips will fall off on their own. You may trim the strips as the wound heals. Do not remove skin adhesive strips that are still stuck to the wound. They will fall off in time. For wound adhesive:  You may briefly wet your wound in the shower or bath. Do not soak or scrub the wound. Do not swim. Avoid periods of heavy perspiration until the skin adhesive has fallen off on its own. After showering or bathing, gently pat the wound dry with a clean towel.  Do not apply liquid medicine, cream medicine, or ointment medicine to your wound while the skin adhesive is in place. This may loosen the film before your wound is healed.  If a dressing is placed over the wound, be careful not to apply tape directly over the skin adhesive. This may cause the adhesive to be pulled off before the wound is healed.  Avoid prolonged exposure to sunlight or tanning lamps while the skin adhesive is in place. Exposure to ultraviolet light in the first year will darken the scar.  The skin adhesive will usually remain in place for 5 to 10 days, then naturally fall off the skin. Do not pick at the adhesive film. You may need  a tetanus shot if:  You cannot remember when you had your last tetanus shot.  You have never had a tetanus shot. If you get a tetanus shot, your arm may swell, get red, and feel warm to the touch. This is common and not a problem. If you need a tetanus shot and you choose not to have one, there is a rare chance of getting tetanus. Sickness from tetanus can be serious. SEEK MEDICAL CARE IF:   You have redness, swelling, or increasing pain in the wound.  You see a red line that goes away from the wound.  You have yellowish-white fluid (pus) coming from the wound.  You have  a fever.  You notice a bad smell coming from the wound or dressing.  Your wound breaks open before or after sutures have been removed.  You notice something coming out of the wound such as wood or glass.  Your wound is on your hand or foot and you cannot move a finger or toe. SEEK IMMEDIATE MEDICAL CARE IF:   Your pain is not controlled with prescribed medicine.  You have severe swelling around the wound causing pain and numbness or a change in color in your arm, hand, leg, or foot.  Your wound splits open and starts bleeding.  You have worsening numbness, weakness, or loss of function of any joint around or beyond the wound.  You develop painful lumps near the wound or on the skin anywhere on your body. MAKE SURE YOU:   Understand these instructions.  Will watch your condition.  Will get help right away if you are not doing well or get worse. Document Released: 04/06/2005 Document Revised: 06/29/2011 Document Reviewed: 09/30/2010 [facility] Patient Information 2015 [place], [place]. This information is not intended to replace advice given to you by your health care provider. Make sure you discuss any questions you have with your health care provider.

## 2014-12-27 ENCOUNTER — Other Ambulatory Visit: Payer: Self-pay | Admitting: Obstetrics and Gynecology

## 2015-04-03 DIAGNOSIS — M79645 Pain in left finger(s): Secondary | ICD-10-CM | POA: Insufficient documentation

## 2015-04-03 DIAGNOSIS — D2112 Benign neoplasm of connective and other soft tissue of left upper limb, including shoulder: Secondary | ICD-10-CM | POA: Insufficient documentation

## 2016-05-25 DIAGNOSIS — L309 Dermatitis, unspecified: Secondary | ICD-10-CM | POA: Diagnosis not present

## 2016-05-25 DIAGNOSIS — R21 Rash and other nonspecific skin eruption: Secondary | ICD-10-CM | POA: Diagnosis not present

## 2016-05-25 DIAGNOSIS — J310 Chronic rhinitis: Secondary | ICD-10-CM | POA: Diagnosis not present

## 2016-06-10 DIAGNOSIS — H40019 Open angle with borderline findings, low risk, unspecified eye: Secondary | ICD-10-CM | POA: Diagnosis not present

## 2016-11-19 DIAGNOSIS — C44519 Basal cell carcinoma of skin of other part of trunk: Secondary | ICD-10-CM | POA: Diagnosis not present

## 2016-11-19 DIAGNOSIS — D485 Neoplasm of uncertain behavior of skin: Secondary | ICD-10-CM | POA: Diagnosis not present

## 2016-11-19 DIAGNOSIS — L905 Scar conditions and fibrosis of skin: Secondary | ICD-10-CM | POA: Diagnosis not present

## 2016-12-02 DIAGNOSIS — C44519 Basal cell carcinoma of skin of other part of trunk: Secondary | ICD-10-CM | POA: Diagnosis not present

## 2016-12-24 DIAGNOSIS — Z86018 Personal history of other benign neoplasm: Secondary | ICD-10-CM | POA: Diagnosis not present

## 2016-12-24 DIAGNOSIS — D1801 Hemangioma of skin and subcutaneous tissue: Secondary | ICD-10-CM | POA: Diagnosis not present

## 2016-12-24 DIAGNOSIS — Z808 Family history of malignant neoplasm of other organs or systems: Secondary | ICD-10-CM | POA: Diagnosis not present

## 2017-02-16 DIAGNOSIS — J019 Acute sinusitis, unspecified: Secondary | ICD-10-CM | POA: Diagnosis not present

## 2017-02-16 DIAGNOSIS — H6993 Unspecified Eustachian tube disorder, bilateral: Secondary | ICD-10-CM | POA: Diagnosis not present

## 2017-02-18 DIAGNOSIS — Z124 Encounter for screening for malignant neoplasm of cervix: Secondary | ICD-10-CM | POA: Diagnosis not present

## 2017-02-18 DIAGNOSIS — Z1231 Encounter for screening mammogram for malignant neoplasm of breast: Secondary | ICD-10-CM | POA: Diagnosis not present

## 2017-02-18 DIAGNOSIS — Z01419 Encounter for gynecological examination (general) (routine) without abnormal findings: Secondary | ICD-10-CM | POA: Diagnosis not present

## 2017-02-18 DIAGNOSIS — N760 Acute vaginitis: Secondary | ICD-10-CM | POA: Diagnosis not present

## 2017-02-22 ENCOUNTER — Other Ambulatory Visit (HOSPITAL_COMMUNITY): Payer: Self-pay | Admitting: Obstetrics & Gynecology

## 2017-02-22 ENCOUNTER — Other Ambulatory Visit: Payer: Self-pay | Admitting: Obstetrics and Gynecology

## 2017-02-22 DIAGNOSIS — R928 Other abnormal and inconclusive findings on diagnostic imaging of breast: Secondary | ICD-10-CM

## 2017-02-24 ENCOUNTER — Ambulatory Visit
Admission: RE | Admit: 2017-02-24 | Discharge: 2017-02-24 | Disposition: A | Discharge status: Home / Self Care | Payer: 59 | Source: Ambulatory Visit | Attending: Obstetrics and Gynecology | Admitting: Obstetrics and Gynecology

## 2017-02-24 DIAGNOSIS — R928 Other abnormal and inconclusive findings on diagnostic imaging of breast: Secondary | ICD-10-CM

## 2017-02-24 DIAGNOSIS — N6489 Other specified disorders of breast: Secondary | ICD-10-CM | POA: Diagnosis not present

## 2017-02-24 DIAGNOSIS — R922 Inconclusive mammogram: Secondary | ICD-10-CM | POA: Diagnosis not present

## 2017-03-25 DIAGNOSIS — N760 Acute vaginitis: Secondary | ICD-10-CM | POA: Diagnosis not present

## 2017-05-12 DIAGNOSIS — E782 Mixed hyperlipidemia: Secondary | ICD-10-CM | POA: Diagnosis not present

## 2017-05-12 DIAGNOSIS — Z1389 Encounter for screening for other disorder: Secondary | ICD-10-CM | POA: Diagnosis not present

## 2017-05-12 DIAGNOSIS — Z0001 Encounter for general adult medical examination with abnormal findings: Secondary | ICD-10-CM | POA: Diagnosis not present

## 2017-05-25 DIAGNOSIS — L309 Dermatitis, unspecified: Secondary | ICD-10-CM | POA: Diagnosis not present

## 2017-05-25 DIAGNOSIS — J310 Chronic rhinitis: Secondary | ICD-10-CM | POA: Diagnosis not present

## 2017-05-25 DIAGNOSIS — R21 Rash and other nonspecific skin eruption: Secondary | ICD-10-CM | POA: Diagnosis not present

## 2017-05-26 DIAGNOSIS — H353131 Nonexudative age-related macular degeneration, bilateral, early dry stage: Secondary | ICD-10-CM | POA: Diagnosis not present

## 2017-06-01 DIAGNOSIS — E748 Other specified disorders of carbohydrate metabolism: Secondary | ICD-10-CM | POA: Diagnosis not present

## 2017-06-29 DIAGNOSIS — D229 Melanocytic nevi, unspecified: Secondary | ICD-10-CM | POA: Diagnosis not present

## 2017-07-08 DIAGNOSIS — L814 Other melanin hyperpigmentation: Secondary | ICD-10-CM | POA: Diagnosis not present

## 2017-07-08 DIAGNOSIS — D485 Neoplasm of uncertain behavior of skin: Secondary | ICD-10-CM | POA: Diagnosis not present

## 2017-08-17 DIAGNOSIS — M7541 Impingement syndrome of right shoulder: Secondary | ICD-10-CM | POA: Diagnosis not present

## 2017-09-16 DIAGNOSIS — L708 Other acne: Secondary | ICD-10-CM | POA: Diagnosis not present

## 2017-09-16 DIAGNOSIS — L304 Erythema intertrigo: Secondary | ICD-10-CM | POA: Diagnosis not present

## 2017-09-16 DIAGNOSIS — L821 Other seborrheic keratosis: Secondary | ICD-10-CM | POA: Diagnosis not present

## 2017-12-28 DIAGNOSIS — Z808 Family history of malignant neoplasm of other organs or systems: Secondary | ICD-10-CM | POA: Diagnosis not present

## 2017-12-28 DIAGNOSIS — L821 Other seborrheic keratosis: Secondary | ICD-10-CM | POA: Diagnosis not present

## 2017-12-28 DIAGNOSIS — D1801 Hemangioma of skin and subcutaneous tissue: Secondary | ICD-10-CM | POA: Diagnosis not present

## 2018-05-18 DIAGNOSIS — Z0001 Encounter for general adult medical examination with abnormal findings: Secondary | ICD-10-CM | POA: Diagnosis not present

## 2018-05-18 DIAGNOSIS — E6609 Other obesity due to excess calories: Secondary | ICD-10-CM | POA: Diagnosis not present

## 2018-05-18 DIAGNOSIS — Z6832 Body mass index (BMI) 32.0-32.9, adult: Secondary | ICD-10-CM | POA: Diagnosis not present

## 2018-05-18 DIAGNOSIS — Z1389 Encounter for screening for other disorder: Secondary | ICD-10-CM | POA: Diagnosis not present

## 2018-05-18 DIAGNOSIS — E669 Obesity, unspecified: Secondary | ICD-10-CM | POA: Diagnosis not present

## 2018-05-18 DIAGNOSIS — E7849 Other hyperlipidemia: Secondary | ICD-10-CM | POA: Diagnosis not present

## 2018-05-24 DIAGNOSIS — J310 Chronic rhinitis: Secondary | ICD-10-CM | POA: Diagnosis not present

## 2018-05-24 DIAGNOSIS — H40019 Open angle with borderline findings, low risk, unspecified eye: Secondary | ICD-10-CM | POA: Diagnosis not present

## 2018-05-24 DIAGNOSIS — L309 Dermatitis, unspecified: Secondary | ICD-10-CM | POA: Diagnosis not present

## 2018-05-24 DIAGNOSIS — R21 Rash and other nonspecific skin eruption: Secondary | ICD-10-CM | POA: Diagnosis not present

## 2018-06-22 DIAGNOSIS — Z01419 Encounter for gynecological examination (general) (routine) without abnormal findings: Secondary | ICD-10-CM | POA: Diagnosis not present

## 2018-06-22 DIAGNOSIS — Z1231 Encounter for screening mammogram for malignant neoplasm of breast: Secondary | ICD-10-CM | POA: Diagnosis not present

## 2018-07-04 DIAGNOSIS — H40013 Open angle with borderline findings, low risk, bilateral: Secondary | ICD-10-CM | POA: Diagnosis not present

## 2018-11-28 ENCOUNTER — Other Ambulatory Visit: Payer: Self-pay | Admitting: Obstetrics and Gynecology

## 2018-11-28 DIAGNOSIS — Z809 Family history of malignant neoplasm, unspecified: Secondary | ICD-10-CM

## 2018-12-28 ENCOUNTER — Other Ambulatory Visit: Payer: Self-pay | Admitting: Obstetrics and Gynecology

## 2018-12-29 ENCOUNTER — Other Ambulatory Visit: Payer: 59

## 2019-01-07 ENCOUNTER — Other Ambulatory Visit: Payer: Self-pay

## 2019-01-07 ENCOUNTER — Ambulatory Visit
Admission: RE | Admit: 2019-01-07 | Discharge: 2019-01-07 | Disposition: A | Discharge status: Home / Self Care | Payer: 59 | Source: Ambulatory Visit | Attending: Obstetrics and Gynecology | Admitting: Obstetrics and Gynecology

## 2019-01-07 DIAGNOSIS — Z809 Family history of malignant neoplasm, unspecified: Secondary | ICD-10-CM

## 2019-01-07 MED ORDER — GADOBUTROL 1 MMOL/ML IV SOLN
9.0000 mL | Freq: Once | INTRAVENOUS | Status: AC | PRN
  Administered 2019-01-07: 9 mL via INTRAVENOUS

## 2019-01-28 IMAGING — MG 2D DIGITAL DIAGNOSTIC UNILATERAL RIGHT MAMMOGRAM WITH CAD AND AD
6 series · 6 of 14 positions shown · non-contrast
Comparison: 02/18/2017 and early

CLINICAL DATA: The patient returns after screening study for
evaluation of a possible right breast mass.

EXAM:
2D DIGITAL DIAGNOSTIC RIGHT MAMMOGRAM WITH CAD AND ADJUNCT TOMO
ULTRASOUND RIGHT BREAST

[R CC]
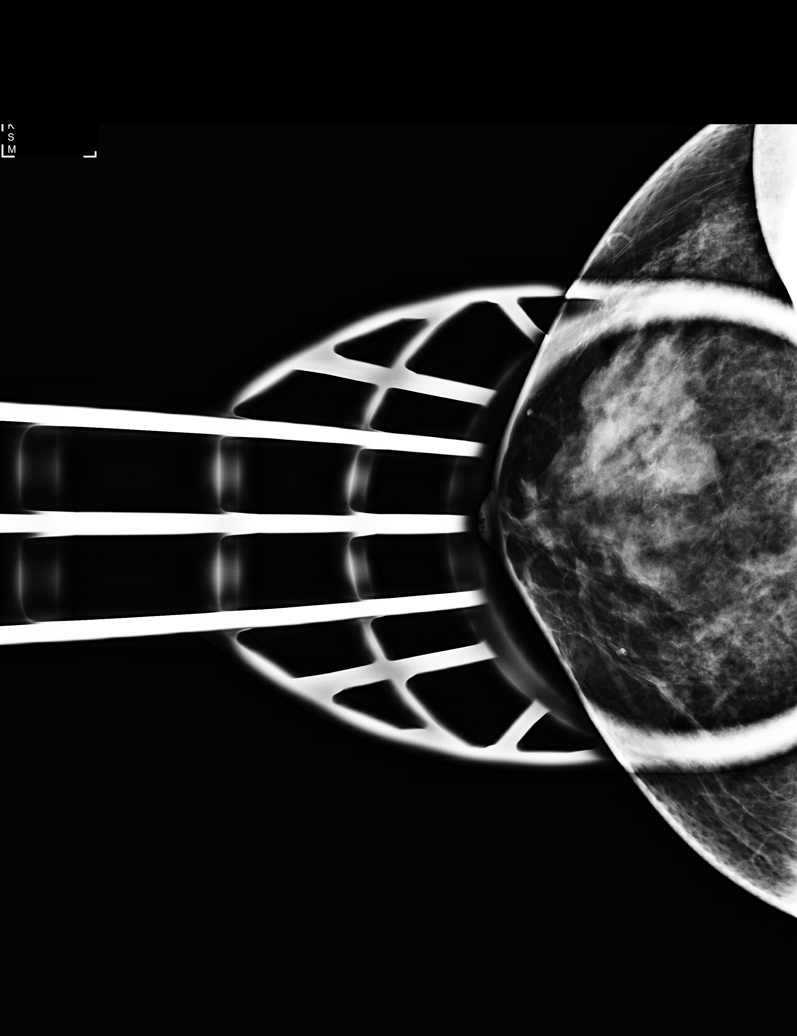

[R CC synth-2D]
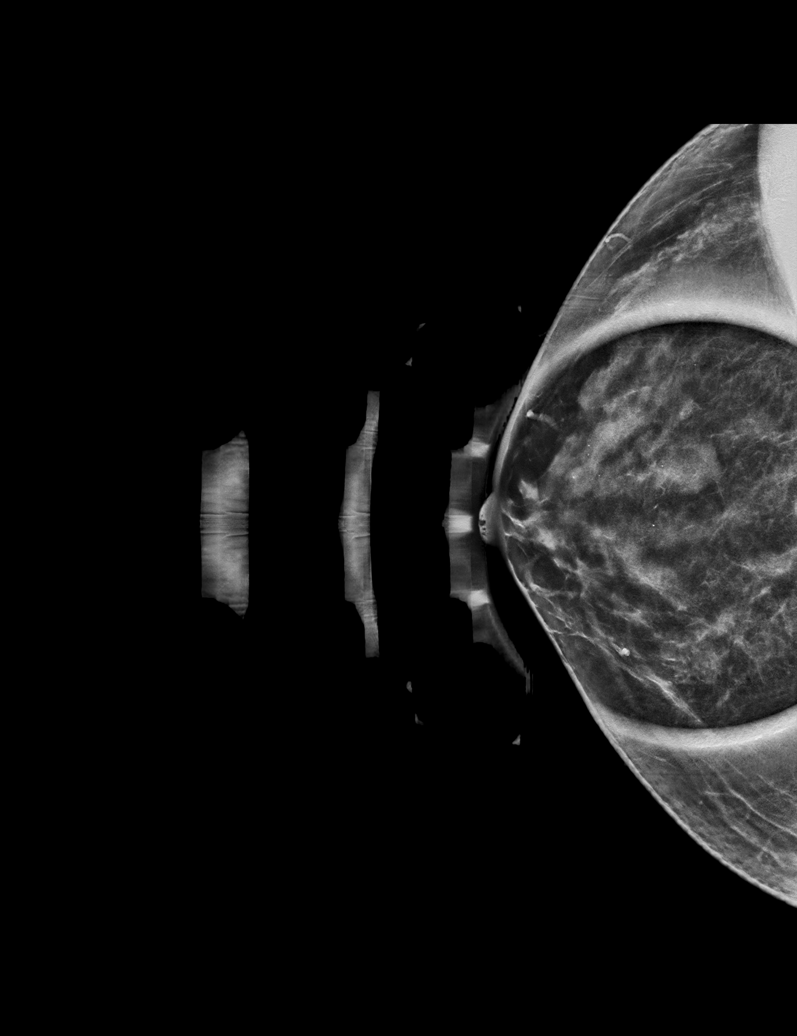

[R MLO]
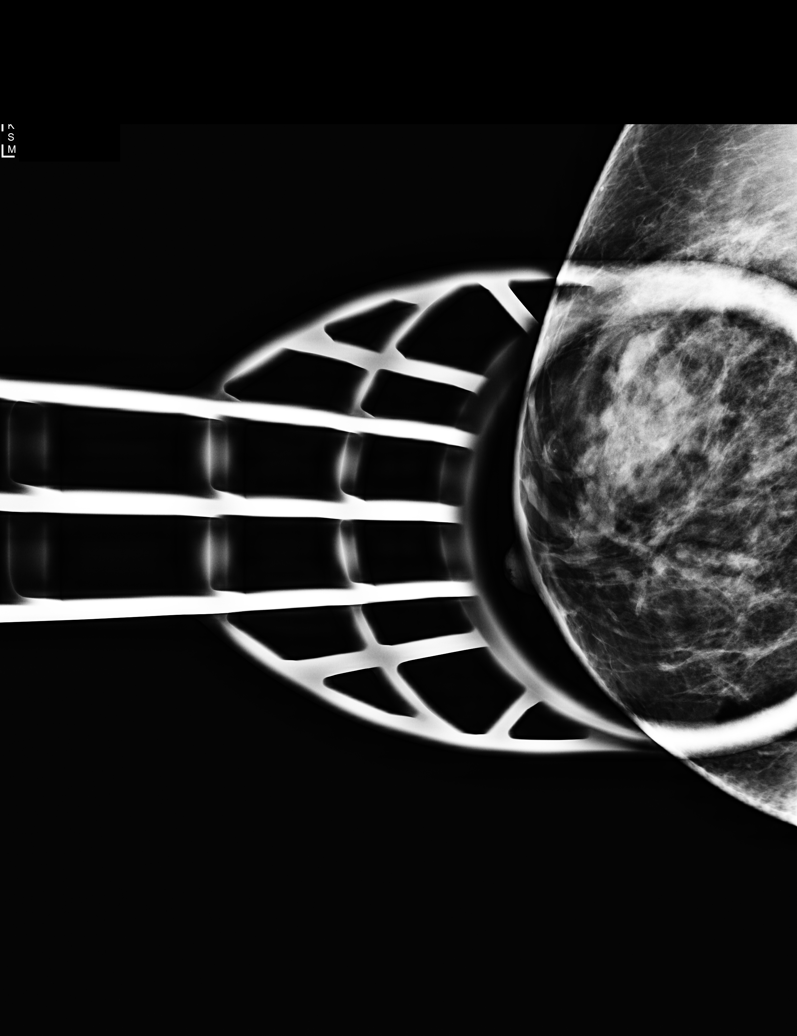

[R MLO synth-2D]
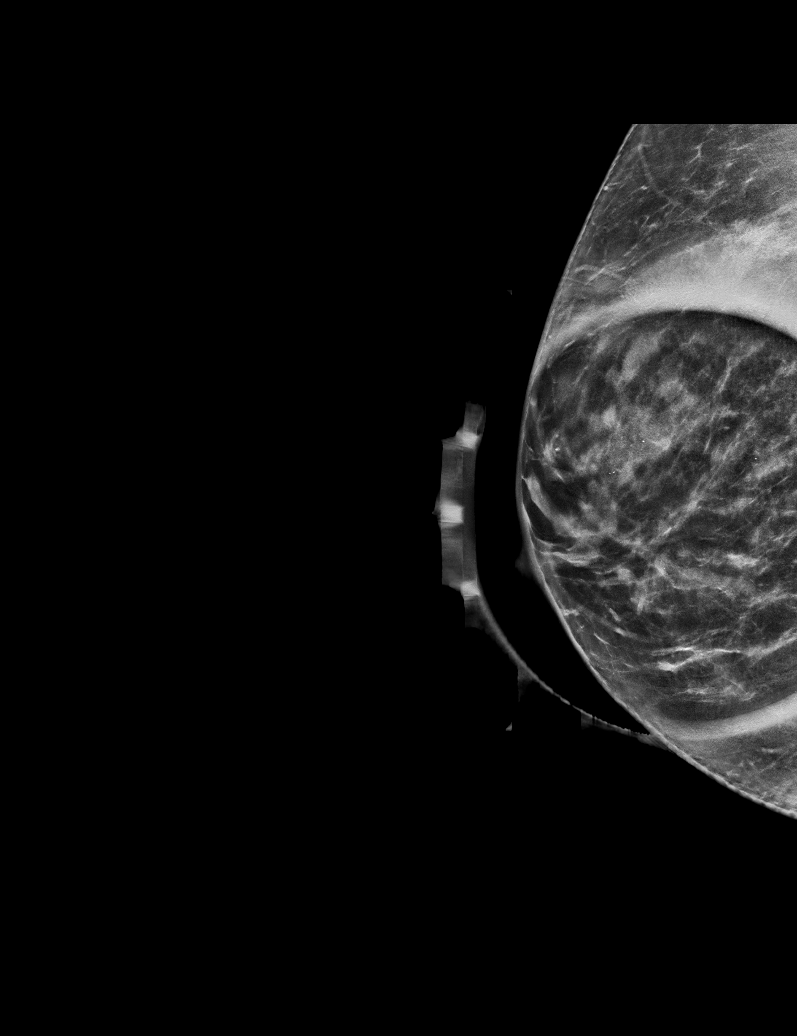

[R MLO tomo · tomo slice 33/66.0]
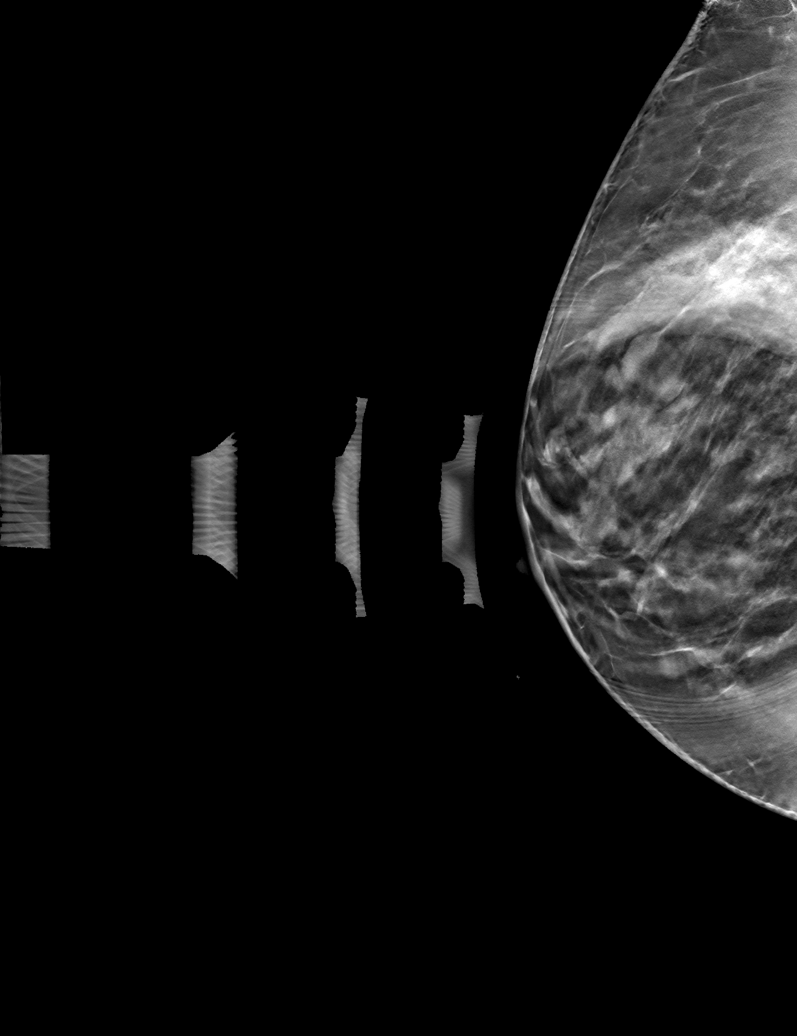

[R CC tomo · tomo slice 31/62.0]
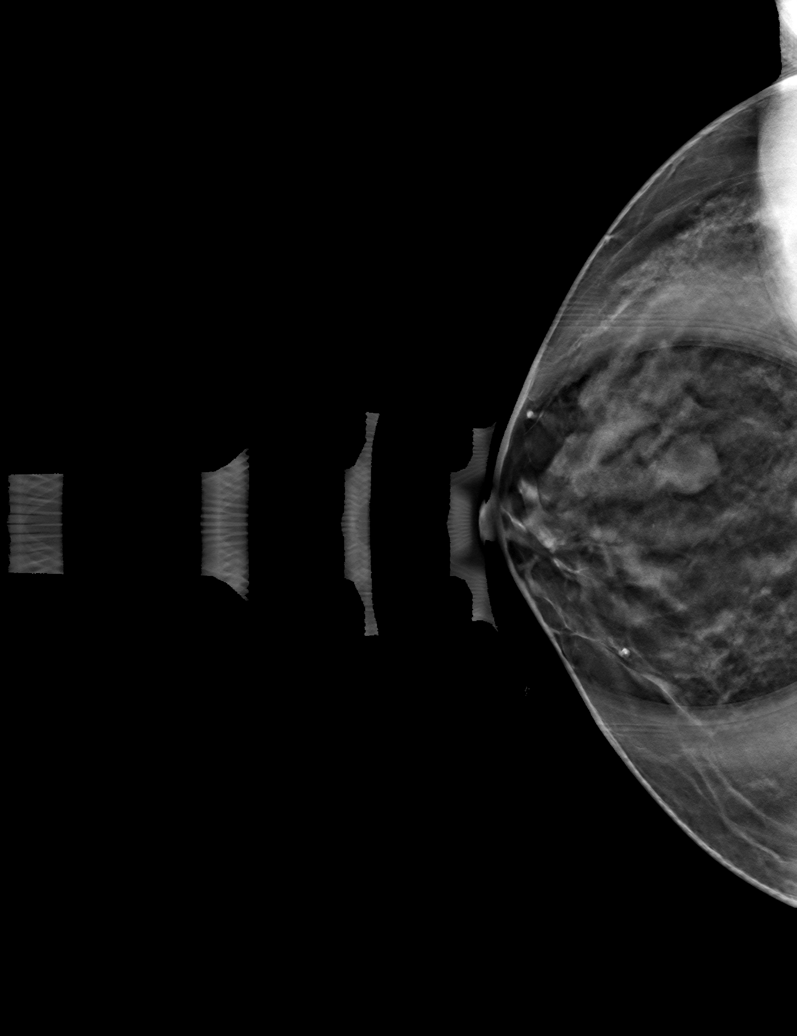

[6 of 14 positions shown; findings below may reference images not displayed]

ACR Breast Density Category c: The breast tissue is heterogeneously
dense, which may obscure small masses.
FINDINGS: Additional 2-D and 3-D images are performed. Views confirm presence
of a circumscribed round mass in the upper central portion of the
right breast.

Mammographic images were processed with CAD.

On physical exam, I palpate soft thickening in the upper central
portion of the right breast near the nipple.

Targeted ultrasound is performed, showing a simple cyst in the 11:30
o'clock location of the right breast 4 cm from nipple. Cyst measures
1.2 x 0.9 x 1.3 cm. No associated solid component or areas of
acoustic shadowing are present.
IMPRESSION: Persistent abnormality is a simple cyst by ultrasound. No
mammographic or ultrasound evidence for malignancy.

RECOMMENDATION:
Screening mammogram in one year.(Code:[id number]-[id number])

I have discussed the findings and recommendations with the patient.
Results were also provided in writing at the conclusion of the
visit. If applicable, a reminder letter will be sent to the patient
regarding the next appointment.

BI-RADS CATEGORY  2: Benign.

## 2019-11-30 ENCOUNTER — Other Ambulatory Visit: Payer: Self-pay | Admitting: Obstetrics and Gynecology

## 2019-11-30 DIAGNOSIS — Z9189 Other specified personal risk factors, not elsewhere classified: Secondary | ICD-10-CM

## 2020-01-10 ENCOUNTER — Ambulatory Visit
Admission: RE | Admit: 2020-01-10 | Discharge: 2020-01-10 | Disposition: A | Discharge status: Home / Self Care | Payer: 59 | Source: Ambulatory Visit | Attending: Obstetrics and Gynecology | Admitting: Obstetrics and Gynecology

## 2020-01-10 ENCOUNTER — Other Ambulatory Visit: Payer: Self-pay

## 2020-01-10 DIAGNOSIS — Z9189 Other specified personal risk factors, not elsewhere classified: Secondary | ICD-10-CM

## 2020-01-10 MED ORDER — GADOBUTROL 1 MMOL/ML IV SOLN
10.0000 mL | Freq: Once | INTRAVENOUS | Status: AC | PRN
  Administered 2020-01-10: 10 mL via INTRAVENOUS

## 2020-01-15 ENCOUNTER — Other Ambulatory Visit: Payer: Self-pay | Admitting: Obstetrics and Gynecology

## 2020-01-15 DIAGNOSIS — R9389 Abnormal findings on diagnostic imaging of other specified body structures: Secondary | ICD-10-CM

## 2020-01-16 ENCOUNTER — Other Ambulatory Visit: Payer: Self-pay | Admitting: Obstetrics and Gynecology

## 2020-01-25 ENCOUNTER — Ambulatory Visit
Admission: RE | Admit: 2020-01-25 | Discharge: 2020-01-25 | Disposition: A | Discharge status: Home / Self Care | Payer: 59 | Source: Ambulatory Visit | Attending: Obstetrics and Gynecology | Admitting: Obstetrics and Gynecology

## 2020-01-25 ENCOUNTER — Other Ambulatory Visit (HOSPITAL_COMMUNITY): Payer: Self-pay | Admitting: Diagnostic Radiology

## 2020-01-25 ENCOUNTER — Other Ambulatory Visit: Payer: Self-pay

## 2020-01-25 DIAGNOSIS — R9389 Abnormal findings on diagnostic imaging of other specified body structures: Secondary | ICD-10-CM

## 2020-01-25 MED ORDER — GADOBUTROL 1 MMOL/ML IV SOLN
9.0000 mL | Freq: Once | INTRAVENOUS | Status: AC | PRN
  Administered 2020-01-25: 9 mL via INTRAVENOUS

## 2020-10-10 ENCOUNTER — Encounter: Payer: Self-pay | Admitting: Neurology

## 2020-10-22 NOTE — Progress Notes (Signed)
Assessment/Plan:   Parkinsonism.  I suspect that this does represent idiopathic Parkinson's disease.  The patient has tremor, bradykinesia, rigidity and mild postural instability.  -We discussed the diagnosis as well as pathophysiology of the disease.  We discussed treatment options as well as prognostic indicators.  Patient education was provided.  -Greater than 50% of the 60 minute visit was spent in counseling answering questions and talking about what to expect now as well as in the future.  We talked about medication options as well as potential future surgical options.  We talked about safety in the home.  -We decided to add carbidopa/levodopa 25/100.  1/2 tab tid x 1 wk, then 1/2 in am & noon & 1 at night for a week, then 1/2 in am &1 at noon &night for a week, then 1 po tid.  Risks, benefits, side effects and alternative therapies were discussed.  The opportunity to ask questions was given and they were answered to the best of my ability.  The patient expressed understanding and willingness to follow the outlined treatment protocols.  -We discussed community resources in the area including patient support groups and community exercise programs for PD and pt education was provided to the patient.  -states that she has had labs by PCP.  We will try to get a copy  -2nd opinion offered  -Her mother has left hand tremor, but no formal diagnosis of anything.  Discussed that genetic testing is available for Parkinson's disease, but a bit academic and that we have no alternative treatment yet/genetic therapy for Parkinson's disease.  We can certainly do the testing if she would like.   2.  Reactive depression  -Patient really has had a lot going on lately.  Her mother had a stroke earlier this month and patient is helping to caregive.  Primary care gynecology placed the patient on Lexapro.  Told the patient that I would recommend staying on this medication.  Statistics were offered to the patient on  Parkinsons Disease patients who end up on antidepressants over the lifetime.  -Offered adjustment to diagnosis counseling.  Subjective:   Martha Phillips was seen today in the movement disorders clinic for neurologic consultation at the request of Doreatha Martin, MD.  The consultation is for the evaluation of tremor and to r/o Parkinsons Disease.  Outside records that were made available to me were reviewed.  Pt seen at [facility], where she had previously been seen for back pain and some pain in the right shoulder, but patient had complained about tremor in the left upper extremity and right leg.  On examination, it was noted that the patient did not have any rest tremor, but did have intention tremor on the left.  Physician was concerned for Parkinson's disease and she was sent for further evaluation.  Tremor: Yes.     How long has it been going on? Tremors came along with back issues - started in the R leg - about a year ago.  Mostly after prolonged sit or lying.  L arm tremor x last few months, mostly with rest/sitting.  Pt is R hand dominant.  Notes that anxiety will bring it out.  No tremor in the R hand but she will note tremor in the L leg if the R gets moving a lot  Fam hx of tremor?  Yes.  , mom has tremor in the L hand but has never been to a doc for it; dad with tremor before he did  Affected by caffeine:  doesn't notice that  Affected by alcohol:  doesn't drink  Affected by stress:  Yes.    Affected by fatigue:  Yes.    Spills soup if on spoon:  No.  Spills glass of liquid if full:  No.  Affects ADL's (tying shoes, brushing teeth, etc):  No. But notes trouble putting in seatbelt because needs the L hand for that  Tremor inducing meds:  No.  Other Specific Symptoms:  Voice: better now; was worse when back pain was worse Sleep: sleeps well  Vivid Dreams:  No.  Acting out dreams:  No. Wet Pillows: No. Postural symptoms:  No.  Falls?  No. Bradykinesia symptoms: slow  movements (had gotten slower/"more narrow" but better now); "slaps" R foot when walks when tired Loss of smell:  No. Loss of taste:  No. Urinary Incontinence:  No. Difficulty Swallowing:  No. Handwriting, micrographia: Yes.  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 Lightheaded:  No.  Syncope: No. Diplopia:  No. Dyskinesia:  No.  Neuroimaging of the brain has not previously been performed.  PREVIOUS MEDICATIONS: none to date  ALLERGIES:   Allergies  Allergen Reactions   Penicillins Anaphylaxis   Formaldehyde    Latex Other (See Comments)    "skin irritation"    CURRENT MEDICATIONS:  Current Outpatient Medications  Medication Instructions   aspirin 81 mg, Daily   cetirizine (ZYRTEC) 10 mg, Daily   Coenzyme Q10 (CO Q 10) 10 MG CAPS Take by mouth.   escitalopram (LEXAPRO) 10 mg, Oral, Daily   estradiol (ESTRACE) 1 MG tablet estradiol 1 mg tablet  TAKE 1 TABLET BY MOUTH EVERY DAY   medroxyPROGESTERone (PROVERA) 5 MG tablet medroxyprogesterone 5 mg tablet  TAKE 1 TABLET BY MOUTH EVERY DAY   Multiple Vitamin (MULTIVITAMIN) capsule 1 capsule, Daily   simvastatin (ZOCOR) 10 mg, Daily at bedtime    Objective:   PHYSICAL EXAMINATION:    VITALS:   Vitals:   11/04/20 0909  BP: (!) 142/80  Pulse: 76  SpO2: 98%  Weight: 188 lb (85.3 kg)  Height: 5' 5.5" (1.664 m)    GEN:  The patient appears stated age and is in NAD. HEENT:  Normocephalic, atraumatic.  The mucous membranes are moist. The superficial temporal arteries are without ropiness or tenderness. CV:  RRR Lungs:  CTAB Neck/HEME:  There are no carotid bruits bilaterally.  Neurological examination:  Orientation: The patient is alert and oriented x3.  Cranial nerves: There is good facial symmetry with mild facial hypomimia.  Extraocular muscles are intact. The visual fields are full to confrontational testing. The speech is fluent and clear. Soft palate rises  symmetrically and there is no tongue deviation. Hearing is intact to conversational tone. Sensation: Sensation is intact to light touch throughout (facial, trunk, extremities). Vibration is intact at the bilateral big toe. There is no extinction with double simultaneous stimulation.  Motor: Strength is 5/5 in the bilateral upper and lower extremities.   Shoulder shrug is equal and symmetric.  There is no pronator drift. Deep tendon reflexes: Deep tendon reflexes are 2/4 at the bilateral biceps, triceps, brachioradialis, patella and achilles. Plantar responses are downgoing bilaterally.  Movement examination: Tone: There is mild increased tone in the bilateral UE Abnormal movements: there is LUE rest tremor that increases with distraction Coordination:  There is  decremation with RAM's, with any form of RAMS, including alternating supination and pronation of the  forearm, hand opening and closing, finger taps, heel taps and toe taps, L>R Gait and Station: The patient has no difficulty arising out of a deep-seated chair without the use of the hands. The patient's stride length is good.  The patient has a negative pull test.     Labs: None avail for review    Total time spent on today's visit was 60 minutes, including both face-to-face time and nonface-to-face time.  Time included that spent on review of records (prior notes available to me/labs/imaging if pertinent), discussing treatment and goals, answering patient's questions and coordinating care.  Cc:  Redmond School, MD

## 2020-11-04 ENCOUNTER — Other Ambulatory Visit: Payer: Self-pay

## 2020-11-04 ENCOUNTER — Encounter: Payer: Self-pay | Admitting: Neurology

## 2020-11-04 ENCOUNTER — Ambulatory Visit (INDEPENDENT_AMBULATORY_CARE_PROVIDER_SITE_OTHER): Payer: 59 | Admitting: Neurology

## 2020-11-04 VITALS — BP 142/80 | HR 76 | Ht 65.5 in | Wt 188.0 lb

## 2020-11-04 DIAGNOSIS — G20A1 Parkinson's disease without dyskinesia, without mention of fluctuations: Secondary | ICD-10-CM

## 2020-11-04 DIAGNOSIS — F329 Major depressive disorder, single episode, unspecified: Secondary | ICD-10-CM | POA: Diagnosis not present

## 2020-11-04 DIAGNOSIS — G20 Parkinson's disease: Secondary | ICD-10-CM | POA: Insufficient documentation

## 2020-11-04 MED ORDER — CARBIDOPA-LEVODOPA 25-100 MG PO TABS: 1.0000 | ORAL_TABLET | Freq: Three times a day (TID) | ORAL | 1 refills | Status: DC

## 2020-11-04 NOTE — Patient Instructions (Addendum)
Start Carbidopa Levodopa as follows: Take 1/2 tablet three times daily, at least 30 minutes before meals (approximately 7am/11am/4pm), for one week Then take 1/2 tablet in the morning, 1/2 tablet in the afternoon, 1 tablet in the evening, at least 30 minutes before meals, for one week Then take 1/2 tablet in the morning, 1 tablet in the afternoon, 1 tablet in the evening, at least 30 minutes before meals, for one week Then take 1 tablet three times daily at 7am/11am/4pm, at least 30 minutes before meals   As a reminder, carbidopa/levodopa can be taken at the same time as a carbohydrate, but we like to have you take your pill either 30 minutes before a protein source or 1 hour after as protein can interfere with carbidopa/levodopa absorption.  The physicians and staff at [facility] are committed to providing excellent care. You may receive a survey requesting feedback about your experience at our office. We strive to receive "very good" responses to the survey questions. If you feel that your experience would prevent you from giving the office a "very good " response, please contact our office to try to remedy the situation. We may be reached at [phone]. Thank you for taking the time out of your busy day to complete the survey.

## 2020-11-08 ENCOUNTER — Institutional Professional Consult (permissible substitution): Payer: 59 | Admitting: Licensed Clinical Social Worker

## 2020-11-19 ENCOUNTER — Telehealth: Payer: Self-pay

## 2020-11-19 NOTE — Telephone Encounter (Signed)
Need to request labs from PCP   Called patient and left a message for a callback and per DPR left a message letting her know we are needing labs from her PCP.

## 2020-12-03 ENCOUNTER — Other Ambulatory Visit: Payer: Self-pay | Admitting: Obstetrics and Gynecology

## 2020-12-03 DIAGNOSIS — Z9189 Other specified personal risk factors, not elsewhere classified: Secondary | ICD-10-CM

## 2020-12-05 ENCOUNTER — Other Ambulatory Visit: Payer: Self-pay | Admitting: Obstetrics and Gynecology

## 2020-12-05 DIAGNOSIS — Z1231 Encounter for screening mammogram for malignant neoplasm of breast: Secondary | ICD-10-CM

## 2020-12-13 ENCOUNTER — Telehealth: Payer: Self-pay | Admitting: Neurology

## 2020-12-13 NOTE — Telephone Encounter (Signed)
Pt called to let Mahina know she has requested the blood work from her PCP, once she gets it , she will send to [place]. If you have any questions, call her

## 2020-12-27 ENCOUNTER — Other Ambulatory Visit: Payer: Self-pay

## 2020-12-27 ENCOUNTER — Ambulatory Visit
Admission: RE | Admit: 2020-12-27 | Discharge: 2020-12-27 | Disposition: A | Discharge status: Home / Self Care | Payer: 59 | Source: Ambulatory Visit | Attending: Obstetrics and Gynecology | Admitting: Obstetrics and Gynecology

## 2020-12-27 DIAGNOSIS — Z9189 Other specified personal risk factors, not elsewhere classified: Secondary | ICD-10-CM

## 2020-12-27 MED ORDER — GADOBUTROL 1 MMOL/ML IV SOLN
8.0000 mL | Freq: Once | INTRAVENOUS | Status: AC | PRN
  Administered 2020-12-27: 8 mL via INTRAVENOUS

## 2021-04-22 NOTE — Progress Notes (Signed)
Assessment/Plan:   1.  Parkinsons Disease, diagnosed July, 2022  -continue carbidopa/levodopa 25/100 tid  -She met with my LCSW today.  -We discussed the importance of safe, cardiovascular exercise and exactly what that meant.  -She asked me several questions and I answered those to the best of my ability today.  2.  GAD  -on lexapro.  Gynecology currently prescribing.  Told her I would be happy to take that over in the future if she needed.    Subjective:   Martha Phillips was seen today in follow up for Parkinsons disease, diagnosed last visit.  My previous records were reviewed prior to todays visit as well as outside records available to me. Started her on levodopa last visit.  She is feeling much better.  No SE.  No longer having tremor but still has internal tremor.  "It almost immediately took care of the symptoms."  Pt denies falls.  Pt denies lightheadedness, near syncope.  No hallucinations.  Mood has been good.  She is looking to become more active in the new year.  Current prescribed movement disorder medications: Carbidopa/levodopa 25/100, 1 tablet 3 times per day (started last visit)   PREVIOUS MEDICATIONS: Sinemet  ALLERGIES:   Allergies  Allergen Reactions   Penicillins Anaphylaxis   Formaldehyde    Latex Other (See Comments)    "skin irritation"    CURRENT MEDICATIONS:  Outpatient Encounter Medications as of 04/24/2021  Medication Sig   aspirin (ASPIRIN LOW DOSE) 81 MG chewable tablet Aspirin Low Dose   carbidopa-levodopa (SINEMET IR) 25-100 MG tablet Take 1 tablet by mouth 3 (three) times daily. 7am/11am/4pm   cetirizine (ZYRTEC) 10 MG tablet Take 10 mg by mouth daily.   Coenzyme Q10 (CO Q 10) 10 MG CAPS Take by mouth.   escitalopram (LEXAPRO) 10 MG tablet Take 10 mg by mouth daily.   estradiol (ESTRACE) 1 MG tablet estradiol 1 mg tablet  TAKE 1 TABLET BY MOUTH EVERY DAY   medroxyPROGESTERone (PROVERA) 5 MG tablet medroxyprogesterone 5 mg tablet  TAKE 1  TABLET BY MOUTH EVERY DAY   Multiple Vitamin (MULTIVITAMIN) capsule Take 1 capsule by mouth daily.   simvastatin (ZOCOR) 10 MG tablet Take 10 mg by mouth at bedtime.   [DISCONTINUED] aspirin 81 MG chewable tablet Chew 81 mg by mouth daily. (Patient not taking: Reported on 04/24/2021)   No facility-administered encounter medications on file as of 04/24/2021.    Objective:   PHYSICAL EXAMINATION:    VITALS:   Vitals:   04/24/21 1010  BP: 130/82  Pulse: 88  SpO2: 95%  Weight: 202 lb 3.2 oz (91.7 kg)  Height: '5\' 6"'  (1.676 m)    GEN:  The patient appears stated age and is in NAD. HEENT:  Normocephalic, atraumatic.  The mucous membranes are moist. The superficial temporal arteries are without ropiness or tenderness. CV:  RRR Lungs:  CTAB Neck/HEME:  There are no carotid bruits bilaterally.  Neurological examination:  Orientation: The patient is alert and oriented x3. Cranial nerves: There is good facial symmetry with facial hypomimia. The speech is fluent and clear. Soft palate rises symmetrically and there is no tongue deviation. Hearing is intact to conversational tone. Sensation: Sensation is intact to light touch throughout Motor: Strength is at least antigravity x4.  Movement examination: Tone: There is nl tone in the UE/LE Abnormal movements: there is normal tone in the UE/LE Coordination:  There is no decremation with RAM's, with any form of RAMS, including alternating supination  and pronation of the forearm, hand opening and closing, finger taps, heel taps and toe taps, bilaterally Gait and Station: The patient has no difficulty arising out of a deep-seated chair without the use of the hands. The patient's stride length is good.  The patient has a negative pull test.         Total time spent on today's visit was 32 minutes, including both face-to-face time and nonface-to-face time.  Time included that spent on review of records (prior notes available to me/labs/imaging if  pertinent), discussing treatment and goals, answering patient's questions and coordinating care.  Cc:  Redmond School, MD

## 2021-04-24 ENCOUNTER — Encounter: Payer: Self-pay | Admitting: Neurology

## 2021-04-24 ENCOUNTER — Ambulatory Visit (INDEPENDENT_AMBULATORY_CARE_PROVIDER_SITE_OTHER): Payer: 59 | Admitting: Neurology

## 2021-04-24 ENCOUNTER — Other Ambulatory Visit: Payer: Self-pay

## 2021-04-24 VITALS — BP 130/82 | HR 88 | Ht 66.0 in | Wt 202.2 lb

## 2021-04-24 DIAGNOSIS — G20 Parkinson's disease: Secondary | ICD-10-CM | POA: Diagnosis not present

## 2021-04-24 MED ORDER — CARBIDOPA-LEVODOPA 25-100 MG PO TABS: 1.0000 | ORAL_TABLET | Freq: Three times a day (TID) | ORAL | 1 refills | Status: DC

## 2021-09-03 ENCOUNTER — Encounter: Payer: Self-pay | Admitting: Podiatry

## 2021-09-03 ENCOUNTER — Ambulatory Visit (INDEPENDENT_AMBULATORY_CARE_PROVIDER_SITE_OTHER): Payer: 59 | Admitting: Podiatry

## 2021-09-03 DIAGNOSIS — M67471 Ganglion, right ankle and foot: Secondary | ICD-10-CM | POA: Diagnosis not present

## 2021-09-03 NOTE — Progress Notes (Signed)
?  Subjective:  ?Patient ID: Martha Phillips, female    DOB: 1960-06-06,   MRN: [id number] ? ?No chief complaint on file. ? ? ?61 y.o. female presents for concern of right second toe blister and concern for fungus on the toes. Relates the blister started about 3 weeks ago. Relates numbness in the area and denies any pain. Denies any treatment . Denies any other pedal complaints. Denies n/v/f/c.  ? ?Past Medical History:  ?Diagnosis Date  ? Allergy   ? Fibroid   ? ? ?Objective:  ?Physical Exam: ?Vascular: DP/PT pulses 2/4 bilateral. CFT <3 seconds. Normal hair growth on digits. No edema.  ?Skin. No lacerations or abrasions bilateral feet. Fluctuant 0.5 cm mass noted to dorsum of distal IPJ of right second digit. No pain to palpation.  ?Musculoskeletal: MMT 5/5 bilateral lower extremities in DF, PF, Inversion and Eversion. Deceased ROM in DF of ankle joint.  ?Neurological: Sensation intact to light touch.  ? ?Assessment:  ? ?1. Ganglion cyst of right foot   ? ? ? ?Plan:  ?Patient was evaluated and treated and all questions answered. ?Discussed ganglion cysts and treatment options with the patient. ?Patient elected to go aspiration of the cyst today.  Procedure note below. ?Advised patient on postprocedure protocol. ?Patient to follow-up in 3 weeks or sooner if symptoms worsen or fail to improve. ? ? ? ?Procedure: Aspiration cyst, right second digit  ?Discussed alternatives, risks, complications and verbal consent was obtained.  ?Location: right second DIPJ.  ?Skin Prep: Alcohol. ?Injectate: 3 cc 1 % lidocaine.  ?Aspirated cyst with 18 gauge needle, about 0.5 cc of gel like viscous fluid aspirated consistent with ganglion cyst ?Disposition: Patient tolerated procedure well. Injection site dressed with a band-aid. Compression bandage applied.  ?Post-procedure care was discussed and return precautions discussed.  ? ? ? ?Lorenda Peck, DPM  ? ? ?

## 2021-09-30 ENCOUNTER — Encounter: Payer: Self-pay | Admitting: Podiatry

## 2021-09-30 ENCOUNTER — Ambulatory Visit (INDEPENDENT_AMBULATORY_CARE_PROVIDER_SITE_OTHER): Payer: 59 | Admitting: Podiatry

## 2021-09-30 DIAGNOSIS — M67471 Ganglion, right ankle and foot: Secondary | ICD-10-CM

## 2021-09-30 NOTE — Progress Notes (Signed)
  Subjective:  Patient ID: Martha Phillips, female    DOB: 07-Jun-1960,   MRN: [id number]  No chief complaint on file.   61 y.o. female presents for follow-up of ganglion aspiration. Relates doing well. It looks better and not having any pain. Denies any treatment . Denies any other pedal complaints. Denies n/v/f/c.   Past Medical History:  Diagnosis Date   Allergy    Fibroid     Objective:  Physical Exam: Vascular: DP/PT pulses 2/4 bilateral. CFT <3 seconds. Normal hair growth on digits. No edema.  Skin. No lacerations or abrasions bilateral feet. Fluctuant 0.5 cm mass noted to dorsum of distal IPJ of right second digit resolved. . No pain to palpation.  Musculoskeletal: MMT 5/5 bilateral lower extremities in DF, PF, Inversion and Eversion. Deceased ROM in DF of ankle joint.  Neurological: Sensation intact to light touch.   Assessment:   1. Ganglion cyst of right foot       Plan:  Patient was evaluated and treated and all questions answered. Discussed ganglion cysts and treatment options with the patient. Patient doing well following procedure.  No reoccurence.  Patient to follow-up as needed.       Lorenda Peck, DPM

## 2021-10-24 ENCOUNTER — Other Ambulatory Visit: Payer: Self-pay | Admitting: Neurology

## 2021-11-04 NOTE — Progress Notes (Unsigned)
    Assessment/Plan:   1.  Parkinsons Disease, diagnosed July, 2022  -continue carbidopa/levodopa 25/100 tid  2.  GAD  -on lexapro.  GYN prescribing.    Subjective:   Martha Phillips was seen today in follow up for Parkinsons disease, diagnosed last visit.  My previous records were reviewed prior to todays visit as well as outside records available to me.  Continues to do well with levodopa.  No falls.  No hallucinations.  She is exercising.  Current prescribed movement disorder medications: Carbidopa/levodopa 25/100, 1 tablet 3 times per day    PREVIOUS MEDICATIONS: Sinemet  ALLERGIES:   Allergies  Allergen Reactions   Penicillins Anaphylaxis   Formaldehyde    Latex Other (See Comments)    "skin irritation"    CURRENT MEDICATIONS:  Outpatient Encounter Medications as of 11/05/2021  Medication Sig   aspirin (ASPIRIN LOW DOSE) 81 MG chewable tablet Aspirin Low Dose   carbidopa-levodopa (SINEMET IR) 25-100 MG tablet TAKE 1 TABLET BY MOUTH 3 (THREE) TIMES DAILY. 7AM/11AM/4PM   cetirizine (ZYRTEC) 10 MG tablet Take 10 mg by mouth daily.   Coenzyme Q10 (CO Q 10) 10 MG CAPS Take by mouth.   escitalopram (LEXAPRO) 10 MG tablet Take 10 mg by mouth daily.   estradiol (ESTRACE) 1 MG tablet estradiol 1 mg tablet  TAKE 1 TABLET BY MOUTH EVERY DAY   medroxyPROGESTERone (PROVERA) 5 MG tablet medroxyprogesterone 5 mg tablet  TAKE 1 TABLET BY MOUTH EVERY DAY   Multiple Vitamin (MULTIVITAMIN) capsule Take 1 capsule by mouth daily.   simvastatin (ZOCOR) 10 MG tablet Take 10 mg by mouth at bedtime.   No facility-administered encounter medications on file as of 11/05/2021.    Objective:   PHYSICAL EXAMINATION:    VITALS:   There were no vitals filed for this visit.   GEN:  The patient appears stated age and is in NAD. HEENT:  Normocephalic, atraumatic.  The mucous membranes are moist. The superficial temporal arteries are without ropiness or tenderness. CV:  RRR Lungs:   CTAB Neck/HEME:  There are no carotid bruits bilaterally.  Neurological examination:  Orientation: The patient is alert and oriented x3. Cranial nerves: There is good facial symmetry with facial hypomimia. The speech is fluent and clear. Soft palate rises symmetrically and there is no tongue deviation. Hearing is intact to conversational tone. Sensation: Sensation is intact to light touch throughout Motor: Strength is at least antigravity x4.  Movement examination: Tone: There is nl tone in the UE/LE Abnormal movements: there is normal tone in the UE/LE Coordination:  There is no decremation with RAM's, with any form of RAMS, including alternating supination and pronation of the forearm, hand opening and closing, finger taps, heel taps and toe taps, bilaterally Gait and Station: The patient has no difficulty arising out of a deep-seated chair without the use of the hands. The patient's stride length is good.  The patient has a negative pull test.         Total time spent on today's visit was *** minutes, including both face-to-face time and nonface-to-face time.  Time included that spent on review of records (prior notes available to me/labs/imaging if pertinent), discussing treatment and goals, answering patient's questions and coordinating care.  Cc:  Redmond School, MD

## 2021-11-05 ENCOUNTER — Encounter: Payer: Self-pay | Admitting: Neurology

## 2021-11-05 ENCOUNTER — Ambulatory Visit (INDEPENDENT_AMBULATORY_CARE_PROVIDER_SITE_OTHER): Payer: 59 | Admitting: Neurology

## 2021-11-05 VITALS — BP 126/74 | HR 92 | Ht 66.0 in | Wt 206.0 lb

## 2021-11-05 DIAGNOSIS — G20 Parkinson's disease: Secondary | ICD-10-CM

## 2021-11-05 MED ORDER — CARBIDOPA-LEVODOPA 25-100 MG PO TABS: 1.0000 | ORAL_TABLET | Freq: Three times a day (TID) | ORAL | 2 refills | Status: DC

## 2021-11-05 NOTE — Patient Instructions (Signed)
Local and Online Resources for Power over Parkinson's Group June 2023  LOCAL [place] PARKINSON'S GROUPS  Power over Parkinson's Group:   Power Over Parkinson's Patient Education Group will be Wednesday, June 14th-*Hybrid meting*- in person at [place] [facility] location and via WEBEX at 2:00 pm.   Upcoming Power over Parkinson's Meetings:  2nd Wednesdays of the month at 2 pm:   June 14th, July 12th Contact Amy Marriott at [email or web address] if interested in participating in this group Parkinson's Care Partners Group:    3rd Mondays, Contact Misty Paladino Atypical Parkinsonian Patient Group:   4th Wednesdays, Contact Misty Paladino If you are interested in participating in these groups with Misty, please contact her directly for how to join those meetings.  Her contact information is [email or web address].    LOCAL EVENTS AND NEW OFFERINGS Dance Class for People with Parkinson's at [employer].  Friday, June 9th at 2 pm.  Led by [employer] DPT students.  Contact [email or web address] to register or with questions. Ice Cream Social at [employer]!  Thursday, June 15th, 5:30-7:00 pm.  RSVP to [email or web address] for attendance and free ice cream. Parkinson's T-shirts for sale!  Designed by a local group member, with funds going to Movement Disorders Fund.  $25.00  Contact Misty to purchase  New PWR! Moves Community Fitness Instructor-Led Class offering at [employer]!  Wednesdays 1-2 pm, starting April 12th.   Contact Susan Laney, Fitness Manager at [employer].  [email or web address]  ONLINE EDUCATION AND SUPPORT Parkinson Foundation:  www.parkinson.org PD Health at Home continues:  Mindfulness Mondays, Wellness Wednesdays, Fitness Fridays  Upcoming Education: Parkinson's 101:  What You and Your Family Should Know.  Wednesday, June 7th at 1:00 pm Register for expert briefings (webinars) at  [email or web address]/resources-support/[email or web address]/expert-[email or web address]-[email or web address] Please check out their website to sign up for emails and see their full online offerings   [employer] Foundation:  www.michaeljfox.org  Third Thursday Webinars:  On the third Thursday of every month at 12 p.m. ET, join our free live webinars to learn about various aspects of living with Parkinson's disease and our work to speed medical breakthroughs. Upcoming Webinar: REPLAY:  From Low Blood Pressure to Bladder Problems:  A Look at Lesser Known Parkinson's Symptoms.  Thursday, June 15th at 12 noon. Check out additional information on their website to see their full online offerings  Davis Phinney Foundation:  www.davisphinneyfoundation.org Upcoming Webinar:   Stay tuned Webinar Series:  Living with Parkinson's Meetup.   Third Thursdays each month, 3 pm Care Partner Monthly Meetup.  With Connie Carpenter Phinney.  First Tuesday of each month, 2 pm Check out additional information to Live Well Today on their website  Parkinson and Movement Disorders (PMD) Alliance:  www.pmdalliance.org [employer]Life Online:  Online Education Events Sign up for emails, which are sent weekly to give you updates on programming and online offerings  Parkinson's Association of [place]:  www.parkinsonassociation.org Information on online support groups, education events, and online exercises including Yoga, Parkinson's exercises and more-LOTS of information on links to PD resources and online events Virtual Support Group through Parkinson's Association of [place]; next one is scheduled for Wednesday, June 7th at 2 pm. (These are typically scheduled for the 1st Wednesday of the month at 2 pm).  Visit website for details. Save the date for "Caring for Parkinson's-Caring for You", 9th Annual Symposium.  In-person event in [place].  September 9th.  More info on registration to come. MOVEMENT AND EXERCISE OPPORTUNITIES PWR!  Moves Classes at [facility].  Wednesdays 10 and 11   am.   Contact Amy Marriott, PT [email or web address] if interested. NEW PWR! Moves Class offering at [employer].  Wednesdays 1-2 pm, starting April 12th.  Contact Bryson Dames, Acupuncturist at [employer].  [email or web address] Here is a link to the PWR!Moves classes on Zoom from [place] - Daily Mon-Sat at 10:00. Via Zoom, FREE and open to all.  There is also a link below via Facebook if you use that platform.  AptDealers.si [email or web address].no  Parkinson's Wellness Recovery (PWR! Moves)  www.pwr4life.org Info on the PWR! Virtual Experience:  You will have access to our expertise through self-assessment, guided plans that start with the PD-specific fundamentals, educational content, tips, Q&A with an expert, and a growing Art therapist of PD-specific pre-recorded and live exercise classes of varying types and intensity - both physical and cognitive! If that is not enough, we offer 1:1 wellness consultations (in-person or virtual) to personalize your PWR! Research scientist (medical).  [place] Fridays:  As part of the PD Health @ Home program, this free video series focuses each week on one aspect of fitness designed to support people living with Parkinson's.  These weekly videos highlight the [place] recent fitness guidelines for people with Parkinson's disease. ModemGamers.si Dance for PD website is offering free, live-stream classes throughout the week, as well as links to [employer] of classes:  https://danceforparkinsons.org/ Virtual dance and Pilates for Parkinson's classes: Click on the Community Tab> Parkinson's Movement Initiative  Tab.  To register for classes and for more information, visit www.[email or web address].co.za and click the "community" tab.  [employer] Parkinson's Cycling Classes  [employer]:  Thursdays @ Noon-Live classes at [employer] ([employer] at [email or web address] or [phone]) [employer]: Virtual Classes Mondays and Thursdays Jeanette Caprice classes Tuesday, Wednesday and Thursday (contact Friona at [email or web address]  or [phone]) [place] Varied levels of classes are offered Tuesdays and Thursdays at [facility].  Stretching with Verdis Frederickson weekly class is also offered for people with Parkinson's To observe a class or for more information, call [phone] or email Hezzie Bump at [email or web address] ADDITIONAL SUPPORT AND RESOURCES Well-Spring [employer]:Online Caregiver Education Opportunities:  www.well-springsolutions.org/caregiver-education/caregiver-support-group.  You may also contact Vickki Muff at [email or web address] or [phone].    Well-Spring Navigator:  [phone] program, a free service to help individuals and families through the journey of determining care for older adults.  The "Navigator" is a [employer], Education officer, museum, who will speak with a prospective client and/or loved ones to provide an assessment of the situation and a set of recommendations for a personalized care plan -- all free of charge, and whether Well-Spring [employer] offers the needed service or not. If the need is not a service we provide, we are well-connected with reputable programs in town that we can refer you to.  www.well-springsolutions.org or to speak with the Navigator, call [phone]. Family Caregiver Programming in June:  [employer], Thursday, June 15th 11-12:30 at [birth year]-1971. [place].  Call [phone] to register   The physicians and staff at [facility] are committed to providing  excellent care. You may receive a survey requesting feedback about your experience at our office. We strive to receive "very good" responses to the survey questions. If you feel that your experience would prevent you from giving the office a "very good " response, please contact our office to try to remedy the situation. We may be reached at [phone]. Thank you for taking the time out of your busy day to complete the survey.

## 2021-12-17 ENCOUNTER — Other Ambulatory Visit: Payer: Self-pay | Admitting: Obstetrics and Gynecology

## 2021-12-17 DIAGNOSIS — Z803 Family history of malignant neoplasm of breast: Secondary | ICD-10-CM

## 2021-12-31 ENCOUNTER — Encounter: Payer: Self-pay | Admitting: Gastroenterology

## 2022-01-01 ENCOUNTER — Other Ambulatory Visit: Payer: 59

## 2022-02-01 ENCOUNTER — Ambulatory Visit
Admission: RE | Admit: 2022-02-01 | Discharge: 2022-02-01 | Disposition: A | Discharge status: Home / Self Care | Payer: 59 | Source: Ambulatory Visit | Attending: Obstetrics and Gynecology | Admitting: Obstetrics and Gynecology

## 2022-02-01 DIAGNOSIS — Z803 Family history of malignant neoplasm of breast: Secondary | ICD-10-CM

## 2022-02-01 MED ORDER — GADOPICLENOL 0.5 MMOL/ML IV SOLN
9.0000 mL | Freq: Once | INTRAVENOUS | Status: AC | PRN
  Administered 2022-02-01: 9 mL via INTRAVENOUS

## 2022-02-03 ENCOUNTER — Ambulatory Visit (INDEPENDENT_AMBULATORY_CARE_PROVIDER_SITE_OTHER): Payer: 59

## 2022-02-03 ENCOUNTER — Ambulatory Visit (INDEPENDENT_AMBULATORY_CARE_PROVIDER_SITE_OTHER): Payer: 59 | Admitting: Podiatry

## 2022-02-03 DIAGNOSIS — M6740 Ganglion, unspecified site: Secondary | ICD-10-CM

## 2022-02-03 DIAGNOSIS — M2041 Other hammer toe(s) (acquired), right foot: Secondary | ICD-10-CM | POA: Diagnosis not present

## 2022-02-03 NOTE — Progress Notes (Signed)
  Subjective:  Patient ID: Martha Phillips, female    DOB: 11/27/1960,  MRN: [id number]  Chief Complaint  Patient presents with   Cyst    Room 3  Pt states her cyst on 2 toe on right foot has came back and she wants to know what permanent option she has     61 y.o. female presents with the above complaint. History confirmed with patient.   Objective:  Physical Exam: warm, good capillary refill, no trophic changes or ulcerative lesions, normal DP and PT pulses, normal sensory exam, and flexible hammertoe deformity and mallet toe with dorsal DIPJ mucoid cyst present.   Radiographs: Multiple views x-ray of the right foot: Some degenerative changes and dorsal spurring around the head of the middle phalanx right second toe Assessment:   1. Hammertoe of right foot   2. Mucoid cyst of joint      Plan:  Patient was evaluated and treated and all questions answered.  We discussed etiology and treatment options of the mucoid cyst and how this relates to her mallet toe deformity with some degenerative changes noted in the DIPJ.  We discussed that drainage likely was not successful ultimately due to the fact this is probably originating from the joint.  We discussed further treatment of this including excision of the cyst and DIPJ arthroplasty of the middle phalanx.  We discussed the risk benefits and potential complications of such a procedure.  Currently she is dealing with helping her mom with long-term illness and has many appointments to take her to.  She will consider her surgical options and return to see me in the future if she is interested in pursuing this, for now she will continue to monitor I do spend silicone offloading pads to help to take pressure from this.  Return if symptoms worsen or fail to improve.

## 2022-03-02 ENCOUNTER — Encounter: Payer: Self-pay | Admitting: Gastroenterology

## 2022-04-03 ENCOUNTER — Ambulatory Visit (AMBULATORY_SURGERY_CENTER): Payer: 59

## 2022-04-03 VITALS — Ht 66.0 in | Wt 205.0 lb

## 2022-04-03 DIAGNOSIS — Z1211 Encounter for screening for malignant neoplasm of colon: Secondary | ICD-10-CM

## 2022-04-03 MED ORDER — NA SULFATE-K SULFATE-MG SULF 17.5-3.13-1.6 GM/177ML PO SOLN: 1.0000 | Freq: Once | ORAL | 0 refills | Status: AC

## 2022-04-03 NOTE — Progress Notes (Signed)
Pre visit completed via phone call; Patient verified name, DOB, and address;  No egg or soy allergy known to patient  No issues known to pt with past sedation with any surgeries or procedures Patient denies ever being told they had issues or difficulty with intubation  No FH of Malignant Hyperthermia Pt is not on diet pills Pt is not on home 02  Pt is not on blood thinners  Pt denies issues with constipation  No A fib or A flutter Have any cardiac testing pending--NO Pt instructed to use Singlecare.com or GoodRx for a price reduction on prep   Insurance verified during [place] appt=[employer]  Patient's chart reviewed by Osvaldo Angst CNRA prior to previsit and patient appropriate for the [place].  Previsit completed and red dot placed by patient's name on their procedure day (on provider's schedule).    GoodRx coupon sent to patient with instructions via mail as well as via MyChart;

## 2022-04-23 ENCOUNTER — Encounter: Payer: Self-pay | Admitting: Gastroenterology

## 2022-04-27 ENCOUNTER — Encounter: Payer: Self-pay | Admitting: Gastroenterology

## 2022-04-27 ENCOUNTER — Ambulatory Visit (AMBULATORY_SURGERY_CENTER): Payer: 59 | Admitting: Gastroenterology

## 2022-04-27 VITALS — BP 143/77 | HR 66 | Temp 98.9°F | Resp 12 | Ht 66.0 in | Wt 205.0 lb

## 2022-04-27 DIAGNOSIS — Z1211 Encounter for screening for malignant neoplasm of colon: Secondary | ICD-10-CM | POA: Diagnosis present

## 2022-04-27 DIAGNOSIS — D122 Benign neoplasm of ascending colon: Secondary | ICD-10-CM

## 2022-04-27 DIAGNOSIS — K5730 Diverticulosis of large intestine without perforation or abscess without bleeding: Secondary | ICD-10-CM

## 2022-04-27 DIAGNOSIS — K6289 Other specified diseases of anus and rectum: Secondary | ICD-10-CM

## 2022-04-27 MED ORDER — SODIUM CHLORIDE 0.9 % IV SOLN: 500.0000 mL | Freq: Once | INTRAVENOUS | Status: AC

## 2022-04-27 NOTE — Progress Notes (Signed)
Report to pacu rn. Vss. Care resumed by rn. 

## 2022-04-27 NOTE — Progress Notes (Signed)
GASTROENTEROLOGY PROCEDURE H&P NOTE   Primary Care Physician: Redmond School, MD    Reason for Procedure:  Colon Cancer screening  Plan:    Colonoscopy  Patient is appropriate for endoscopic procedure(s) in the ambulatory ([place]) setting.  The nature of the procedure, as well as the risks, benefits, and alternatives were carefully and thoroughly reviewed with the patient. Ample time for discussion and questions allowed. The patient understood, was satisfied, and agreed to proceed.     HPI: Martha Phillips is a 62 y.o. female who presents for colonoscopy for routine Colon Cancer screening.  No active GI symptoms.  No known family history of colon cancer or related malignancy.  Patient is otherwise without complaints or active issues today.  Last colonoscopy was 11/2011 and normal with recommendation to repeat in 10 years.  Past Medical History:  Diagnosis Date   Allergy    Arthritis    lower back and all toes   Fibroid    Hyperlipidemia    preventative due to family hx   Lower back pain    sciatica   Parkinsons    on meds    Past Surgical History:  Procedure Laterality Date   [place]  2012   POLYPECTOMY  2015   Uterus with ablation   UTERINE FIBROID SURGERY  2012   2017=submucosoll resected   WISDOM TOOTH EXTRACTION      Prior to Admission medications   Medication Sig Start Date End Date Taking? Authorizing Provider  aspirin (ASPIRIN LOW DOSE) 81 MG chewable tablet Chew 81 mg by mouth daily.    [provider]  carbidopa-levodopa (SINEMET IR) 25-100 MG tablet Take 1 tablet by mouth 3 (three) times daily. 7am/11am/4pm 11/05/21   Tat, Eustace Quail, DO  cetirizine (ZYRTEC) 10 MG tablet Take 10 mg by mouth daily.    [provider]  clobetasol ointment (TEMOVATE) 1.82 % Apply 1 Application topically 2 (two) times daily. 01/16/20   [provider]  Coenzyme Q10 (CO Q 10) 10 MG CAPS Take 1 tablet by mouth daily at  6 (six) AM.    [provider]  [employer] SUNSCREEN SPF 30 EX Apply 1 application  topically daily at 6 (six) AM.    [provider]  escitalopram (LEXAPRO) 10 MG tablet Take 10 mg by mouth daily.    [provider]  estradiol (ESTRACE) 1 MG tablet estradiol 1 mg tablet  TAKE 1 TABLET BY MOUTH EVERY DAY 01/08/15   [provider]  medroxyPROGESTERone (PROVERA) 5 MG tablet medroxyprogesterone 5 mg tablet  TAKE 1 TABLET BY MOUTH EVERY DAY 01/08/15   [provider]  Multiple Vitamin (MULTIVITAMIN) capsule Take 1 capsule by mouth daily.    [provider]  simvastatin (ZOCOR) 10 MG tablet Take 10 mg by mouth at bedtime.    [provider]    Current Outpatient Medications  Medication Sig Dispense Refill   aspirin (ASPIRIN LOW DOSE) 81 MG chewable tablet Chew 81 mg by mouth daily.     carbidopa-levodopa (SINEMET IR) 25-100 MG tablet Take 1 tablet by mouth 3 (three) times daily. 7am/11am/4pm 270 tablet 2   cetirizine (ZYRTEC) 10 MG tablet Take 10 mg by mouth daily.     clobetasol ointment (TEMOVATE) 9.93 % Apply 1 Application topically 2 (two) times daily.     Coenzyme Q10 (CO Q 10) 10 MG CAPS Take 1 tablet by mouth daily at 6 (six) AM.  CVS SUNSCREEN SPF 30 EX Apply 1 application  topically daily at 6 (six) AM.     escitalopram (LEXAPRO) 10 MG tablet Take 10 mg by mouth daily.     estradiol (ESTRACE) 1 MG tablet estradiol 1 mg tablet  TAKE 1 TABLET BY MOUTH EVERY DAY     medroxyPROGESTERone (PROVERA) 5 MG tablet medroxyprogesterone 5 mg tablet  TAKE 1 TABLET BY MOUTH EVERY DAY     Multiple Vitamin (MULTIVITAMIN) capsule Take 1 capsule by mouth daily.     simvastatin (ZOCOR) 10 MG tablet Take 10 mg by mouth at bedtime.     No current facility-administered medications for this visit.    Allergies as of 04/27/2022 - Review Complete 04/27/2022  Allergen Reaction Noted   Penicillins Anaphylaxis 09/22/2011   Formaldehyde Hives, Itching,  Swelling, Dermatitis, and Rash 11/04/2020   Gold sodium thiomalate Hives, Itching, Swelling, Dermatitis, and Rash 02/04/2021   Lanolin Hives, Itching, Swelling, Dermatitis, and Rash 02/04/2021   Latex Hives, Itching, Swelling, Dermatitis, and Rash 11/06/2011    Family History  Problem Relation Age of Onset   Colon polyps Mother 34   Stroke Mother    Heart failure Father    Cancer Sister 81       uterine @ 110 and cervical '@30'$    Colon cancer Neg Hx    Stomach cancer Neg Hx    Esophageal cancer Neg Hx    Rectal cancer Neg Hx     Social History   Socioeconomic History   Marital status: Divorced    Spouse name: Not on file   Number of children: Not on file   Years of education: Not on file   Highest education level: Not on file  Occupational History   Occupation: Best boy: [employer]  Tobacco Use   Smoking status: Never   Smokeless tobacco: Never  Vaping Use   Vaping Use: Never used  Substance and Sexual Activity   Alcohol use: No   Drug use: No   Sexual activity: Never    Birth control/protection: Pill    Comment: Loestrin 24 cont.  cycle control  Other Topics Concern   Not on file  Social History Narrative   Right Handed   Lives in a one story home    Social Determinants of Health   Financial Resource Strain: Not on file  Food Insecurity: Not on file  Transportation Needs: Not on file  Physical Activity: Not on file  Stress: Not on file  Social Connections: Not on file  Intimate Partner Violence: Not on file    Physical Exam: Vital signs in last 24 hours: '@There'$  were no vitals taken for this visit. GEN: NAD EYE: Sclerae anicteric ENT: MMM CV: Non-tachycardic Pulm: CTA b/l GI: Soft, NT/ND NEURO:  Alert & Oriented x 3   Gerrit Heck, DO [facility]   04/27/2022 12:54 PM

## 2022-04-27 NOTE — Op Note (Signed)
[facility] Patient Name: Martha Phillips Procedure Date: 04/27/2022 1:15 PM MRN: [id number] Endoscopist: Gerrit Heck , MD, [id number] Age: 62 Referring MD:  Date of Birth: Dec 25, 1960 Gender: Female Account #: [id number] Procedure:                Colonoscopy Indications:              Screening for colorectal malignant neoplasm.                           Last colonoscopy was 10 years ago and normal/no                            polyps. Otherwise, no GI symptoms. Medicines:                Monitored Anesthesia Care Procedure:                Pre-Anesthesia Assessment:                           - Prior to the procedure, a History and Physical                            was performed, and patient medications and                            allergies were reviewed. The patient's tolerance of                            previous anesthesia was also reviewed. The risks                            and benefits of the procedure and the sedation                            options and risks were discussed with the patient.                            All questions were answered, and informed consent                            was obtained. Prior Anticoagulants: The patient has                            taken no anticoagulant or antiplatelet agents. ASA                            Grade Assessment: II - A patient with mild systemic                            disease. After reviewing the risks and benefits,                            the patient was deemed in satisfactory condition to  undergo the procedure.                           After obtaining informed consent, the colonoscope                            was passed under direct vision. Throughout the                            procedure, the patient's blood pressure, pulse, and                            oxygen saturations were monitored continuously. The                            CF [id number] #[id number] was  introduced through the anus                            and advanced to the the terminal ileum. The                            colonoscopy was performed without difficulty. The                            patient tolerated the procedure well. The quality                            of the bowel preparation was good. The terminal                            ileum, ileocecal valve, appendiceal orifice, and                            rectum were photographed. Scope In: 1:30:58 PM Scope Out: 1:42:05 PM Scope Withdrawal Time: 0 hours 8 minutes 19 seconds  Total Procedure Duration: 0 hours 11 minutes 7 seconds  Findings:                 The perianal exam was abnormal, consistent with                            known history of Lichen Sclerosis.                           A 3 mm polyp was found in the ascending colon. The                            polyp was sessile. The polyp was removed with a                            cold snare. Resection and retrieval were complete.                            Estimated blood loss was minimal.  A few medium-mouthed and small-mouthed diverticula                            were found in the sigmoid colon.                           Anal papilla(e) were hypertrophied.                           The terminal ileum appeared normal. Complications:            No immediate complications. Estimated Blood Loss:     Estimated blood loss was minimal. Impression:               - Abnormal perianal exam, consistent with known                            history of Lichen Sclerosis.                           - One 3 mm polyp in the ascending colon, removed                            with a cold snare. Resected and retrieved.                           - Diverticulosis in the sigmoid colon.                           - Anal papilla(e) were hypertrophied.                           - The examined portion of the ileum was normal. Recommendation:           -  Patient has a contact number available for                            emergencies. The signs and symptoms of potential                            delayed complications were discussed with the                            patient. Return to normal activities tomorrow.                            Written discharge instructions were provided to the                            patient.                           - Resume previous diet.                           - Continue present medications.                           -  Await pathology results.                           - Repeat colonoscopy for surveillance based on                            pathology results.                           - Return to GI office PRN. Gerrit Heck, MD 04/27/2022 1:48:51 PM

## 2022-04-27 NOTE — Patient Instructions (Signed)
Impression/Recommendations:  Polyp and diverticulosis handouts given to patient.  Resume previous diet. Continue present medications. Await pathology results.  Repeat colonoscopy for surveillance based on pathology results.  Return to GI office as needed.  YOU HAD AN ENDOSCOPIC PROCEDURE TODAY AT [facility]:   Refer to the procedure report that was given to you for any specific questions about what was found during the examination.  If the procedure report does not answer your questions, please call your gastroenterologist to clarify.  If you requested that your care partner not be given the details of your procedure findings, then the procedure report has been included in a sealed envelope for you to review at your convenience later.  YOU SHOULD EXPECT: Some feelings of bloating in the abdomen. Passage of more gas than usual.  Walking can help get rid of the air that was put into your GI tract during the procedure and reduce the bloating. If you had a lower endoscopy (such as a colonoscopy or flexible sigmoidoscopy) you may notice spotting of blood in your stool or on the toilet paper. If you underwent a bowel prep for your procedure, you may not have a normal bowel movement for a few days.  Please Note:  You might notice some irritation and congestion in your nose or some drainage.  This is from the oxygen used during your procedure.  There is no need for concern and it should clear up in a day or so.  SYMPTOMS TO REPORT IMMEDIATELY:  Following lower endoscopy (colonoscopy or flexible sigmoidoscopy):  Excessive amounts of blood in the stool  Significant tenderness or worsening of abdominal pains  Swelling of the abdomen that is new, acute  Fever of 100F or higher For urgent or emergent issues, a gastroenterologist can be reached at any hour by calling [phone]. Do not use MyChart messaging for urgent concerns.    DIET:  We do recommend a small meal at first,  but then you may proceed to your regular diet.  Drink plenty of fluids but you should avoid alcoholic beverages for 24 hours.  ACTIVITY:  You should plan to take it easy for the rest of today and you should NOT DRIVE or use heavy machinery until tomorrow (because of the sedation medicines used during the test).    FOLLOW UP: Our staff will call the number listed on your records the next business day following your procedure.  We will call around 7:15- 8:00 am to check on you and address any questions or concerns that you may have regarding the information given to you following your procedure. If we do not reach you, we will leave a message.     If any biopsies were taken you will be contacted by phone or by letter within the next 1-3 weeks.  Please call us at [phone] if you have not heard about the biopsies in 3 weeks.    SIGNATURES/CONFIDENTIALITY: You and/or your care partner have signed paperwork which will be entered into your electronic medical record.  These signatures attest to the fact that that the information above on your After Visit Summary has been reviewed and is understood.  Full responsibility of the confidentiality of this discharge information lies with you and/or your care-partner.

## 2022-04-27 NOTE — Progress Notes (Signed)
Called to room to assist during endoscopic procedure.  Patient ID and intended procedure confirmed with present staff. Received instructions for my participation in the procedure from the performing physician.  

## 2022-04-27 NOTE — Progress Notes (Signed)
Pt's states no medical or surgical changes since previsit or office visit. 

## 2022-04-28 ENCOUNTER — Telehealth: Payer: Self-pay | Admitting: *Deleted

## 2022-04-28 NOTE — Telephone Encounter (Signed)
  Follow up Call-     04/27/2022    1:01 PM  Call back number  Post procedure Call Back phone  # [phone]  Permission to leave phone message Yes     Patient questions:  Do you have a fever, pain , or abdominal swelling? No. Pain Score  0 *  Have you tolerated food without any problems? Yes.    Have you been able to return to your normal activities? Yes.    Do you have any questions about your discharge instructions: Diet   No. Medications  No. Follow up visit  No.  Do you have questions or concerns about your Care? No.  Actions: * If pain score is 4 or above: No action needed, pain <4.

## 2022-05-06 ENCOUNTER — Encounter: Payer: Self-pay | Admitting: Gastroenterology

## 2022-05-07 NOTE — Progress Notes (Signed)
Assessment/Plan:   1.  Parkinsons Disease, diagnosed July, 2022  -continue carbidopa/levodopa 25/100 tid  -follows with Dr. Delman Cheadle for dermatology  -information on nutrition printed and information given to Bing Plume, if interested  -information given on pickleball for Parkinsons Disease   2.  GAD  -on lexapro.  GYN prescribing.    Subjective:   Martha Phillips was seen today in follow up for Parkinsons disease, diagnosed last visit.  My previous records were reviewed prior to todays visit as well as outside records available to me.  Pt denies falls.  Her mother has been ill so she has been caretaking and not been doing a lot of exercise but is planning on focusing on diet/exercise this year.  She asks about information for nutrition and Parkinsons Disease.  She got a bike.   Pt denies lightheadedness, near syncope.  No hallucinations.  Mood has been good.   Current prescribed movement disorder medications: Carbidopa/levodopa 25/100, 1 tablet 3 times per day    PREVIOUS MEDICATIONS: Sinemet  ALLERGIES:   Allergies  Allergen Reactions   Penicillins Anaphylaxis   Formaldehyde Hives, Itching, Swelling, Dermatitis and Rash    Skin irritations   Gold Sodium Thiomalate Hives, Itching, Swelling, Dermatitis and Rash    Skin irritation   Lanolin Hives, Itching, Swelling, Dermatitis and Rash   Latex Hives, Itching, Swelling, Dermatitis and Rash    skin irritation    CURRENT MEDICATIONS:  Outpatient Encounter Medications as of 05/11/2022  Medication Sig   aspirin (ASPIRIN LOW DOSE) 81 MG chewable tablet Chew 81 mg by mouth daily.   carbidopa-levodopa (SINEMET IR) 25-100 MG tablet Take 1 tablet by mouth 3 (three) times daily. 7am/11am/4pm   cetirizine (ZYRTEC) 10 MG tablet Take 10 mg by mouth daily.   clobetasol ointment (TEMOVATE) 2.72 % Apply 1 Application topically 2 (two) times daily.   Coenzyme Q10 (CO Q 10) 10 MG CAPS Take 1 tablet by mouth daily at 6 (six) AM.   [employer]  SUNSCREEN SPF 30 EX Apply 1 application  topically daily at 6 (six) AM.   escitalopram (LEXAPRO) 10 MG tablet Take 10 mg by mouth daily.   estradiol (ESTRACE) 1 MG tablet estradiol 1 mg tablet  TAKE 1 TABLET BY MOUTH EVERY DAY   medroxyPROGESTERone (PROVERA) 5 MG tablet medroxyprogesterone 5 mg tablet  TAKE 1 TABLET BY MOUTH EVERY DAY   Multiple Vitamin (MULTIVITAMIN) capsule Take 1 capsule by mouth daily.   simvastatin (ZOCOR) 10 MG tablet Take 10 mg by mouth at bedtime.   Facility-Administered Encounter Medications as of 05/11/2022  Medication   0.9 %  sodium chloride infusion    Objective:   PHYSICAL EXAMINATION:    VITALS:   Vitals:   05/11/22 0809  BP: 124/82  Pulse: 71  SpO2: 98%  Weight: 207 lb (93.9 kg)  Height: '5\' 6"'$  (1.676 m)   GEN:  The patient appears stated age and is in NAD. HEENT:  Normocephalic, atraumatic.  The mucous membranes are moist. The superficial temporal arteries are without ropiness or tenderness. CV:  RRR Lungs:  CTAB   Neurological examination:  Orientation: The patient is alert and oriented x3. Cranial nerves: There is good facial symmetry with min facial hypomimia. The speech is fluent and clear. Soft palate rises symmetrically and there is no tongue deviation. Hearing is intact to conversational tone. Sensation: Sensation is intact to light touch throughout Motor: Strength is at least antigravity x4.  Movement examination: Tone: There is nl tone  in the UE/LE Abnormal movements: there is no tremor.  There is axial/head dyskinesia only with distraction Coordination:  There is no decremation with RAM's, with any form of RAMS, including alternating supination and pronation of the forearm, hand opening and closing, finger taps, heel taps and toe taps, bilaterally Gait and Station: The patient has no difficulty arising out of a deep-seated chair without the use of the hands. The patient's stride length is good.    Total time spent on today's  visit was 22 minutes, including both face-to-face time and nonface-to-face time.  Time included that spent on review of records (prior notes available to me/labs/imaging if pertinent), discussing treatment and goals, answering patient's questions and coordinating care.  Cc:  Redmond School, MD

## 2022-05-11 ENCOUNTER — Ambulatory Visit (INDEPENDENT_AMBULATORY_CARE_PROVIDER_SITE_OTHER): Payer: 59 | Admitting: Neurology

## 2022-05-11 ENCOUNTER — Encounter: Payer: Self-pay | Admitting: Neurology

## 2022-05-11 VITALS — BP 124/82 | HR 71 | Ht 66.0 in | Wt 207.0 lb

## 2022-05-11 DIAGNOSIS — G20A1 Parkinson's disease without dyskinesia, without mention of fluctuations: Secondary | ICD-10-CM | POA: Diagnosis not present

## 2022-05-11 MED ORDER — CARBIDOPA-LEVODOPA 25-100 MG PO TABS: 1.0000 | ORAL_TABLET | Freq: Three times a day (TID) | ORAL | 2 refills | Status: DC

## 2022-05-11 NOTE — Patient Instructions (Addendum)
Bing Plume does nutritional therapy online for Parkinsons Disease : [email or web address]  You can contact Sarah chambers at [email or web address] if you are interested in joining the Thrive Group (the younger group doing exercise)  Local and Online Resources for Power over Parkinson's Group  January 2024   [place] over Parkinson's Group:    Power Over Parkinson's Patient Education Group will be Wednesday, January 10th-*Hybrid meting*- in person at [facility] location and via [facility], 2:00-3:00 pm.   Starting in November, Power over December and Care Partner Groups will meet together, with plans for separate break out session for caregivers (*this will be evolving over the next few months) Upcoming Power over Parkinson's Meetings/Care Partner Support:  2nd Wednesdays of the month at 2 pm:   January 10th, February 14th [place] at [email or web address] if interested in participating in this group    [place]! Moves [address] Instructor-Led Classes offering at [employer]!  TUESDAYS and Wednesdays 1-2 pm.   Contact [birth year]1994 at  [email or web address]  or [phone] (Tuesday classes are modified for chair and standing only) Drumming for Parkinson's will be held on 2nd and 4th Mondays at 11:00 am.   Located at the [place] ([place]  Contact [address] at [email or web address] or [place] Class, Mondays at 11 am.  Call [phone] for details Let's Try Pickleball-$25 for 6 weeks of Pickleball in [place].  Contact [place] for more details and for dates.  [email or web address] SAVE THE DATE and REGISTER:  [place] Chapter of Parkinson's Foundation:  Parkinson's Symposium.  Conversations about Parkinson's.  Saturday, June 20, 2022, 9:00 am-2:00 pm.  [place], *In person or online via [place]*.  Register at [place] or call [email or web address] at [phone].   [place]:  [email or web address]  PD Health at Home continues:  Mindfulness Mondays, Wellness Wednesdays, Fitness Fridays   Upcoming Education:    06-20-1981 your Voice:  Environmental health practitioner. Wednesday, January 3rd,  1-2 pm  Managing Weight Loss & Retaining Muscle Mass.  Wednesday, Jan 10th, 1-2 pm Changes in Speech and Voice.  Wednesday, January 17th, 1-2 pm Register for virtual education and 12-20-1999 (webinars) at Patent attorney Please check out their website to sign up for emails and see their full online offerings      [place]:  [email or web address]   Third Thursday Webinars:  On the third Thursday of every month at 12 p.m. ET, join our free live webinars to learn about various aspects of living with Parkinson's disease and our work to speed medical breakthroughs.  Upcoming Webinar:  New Year, New Moves!  Explore Exercise for Life with Parkinson's.  Thursday, January 18th at 12 noon. Check out additional information on their website to see their full online offerings    [facility]:  [email or web address]  Upcoming Webinar:   Physical Therapy and Parkinson's.  Thursday, January 11th, 2 pm  Webinar Series:  Living with Parkinson's Meetup.   Third Thursdays each month, 3 pm  Care Partner Monthly Meetup.  With 06-04-1984 [place].  First Tuesday of each month, 2 pm  Check out additional information to Live Well Today on their website    Parkinson and Movement Disorders (PMD) Alliance:  www.[email or web address]  [employer] Online:  Online Education Events  Sign up for emails, which are sent weekly  to give you updates on programming and online offerings    Parkinson's Association of the  [place]:  www.[email or web address]  Information on online support groups, education events, and online exercises including Yoga, Parkinson's exercises and more-LOTS of information on links to PD resources and online events  Virtual Support Group through Parkinson's Association of the [place]; next one is scheduled for Wednesday, Feb 7th  MOVEMENT AND EXERCISE OPPORTUNITIES  PWR! Moves Classes at [place].  Wednesdays 10 and 11 am.   Contact Amy Marriott, PT [email or web address] if interested.  NEW PWR! Moves Class offerings at [employer].  *TUESDAYS* and Wednesdays 1-2 pm.    Contact Vonna Kotyk at  [email or web address]    Parkinson's Wellness Recovery (PWR! Moves)  www.[email or web address]  Info on the PWR! Virtual Experience:  You will have access to our expertise?through self-assessment, guided plans that start with the PD-specific fundamentals, educational content, tips, Q&A with an expert, and a growing Art therapist of PD-specific pre-recorded and live exercise classes of varying types and intensity - both physical and cognitive! If that is not enough, we offer 1:1 wellness consultations (in-person or virtual) to personalize your PWR! Research scientist (medical).   [place] Fridays:   As part of the PD Health @ Home program, this free video series focuses each week on one aspect of fitness designed to support people living with Parkinson's.? These weekly videos highlight the [place] fitness guidelines for people with Parkinson's disease.  ModemGamers.[email or web address]   Dance for PD website is offering free, live-stream classes throughout the week, as well as links to [employer] of classes:  [email or web address]  Virtual dance and Pilates for Parkinson's classes: Click on the Community Tab> Parkinson's Movement Initiative Tab.  To register for classes and for more information, visit  [email or web address] and click the "community" tab.   [employer] Parkinson's Cycling Classes   [employer]:  Thursdays @ Noon-Live classes at [employer] ([employer] at [email or web address]?or [phone])  [employer]: Virtual Classes Mondays and Thursdays Jeanette Caprice classes Tuesday, Wednesday and Thursday (contact [place] at [email or web address] ?or [phone])  [place]  Varied levels of classes are offered Tuesdays and Thursdays at [employer].   Stretching with Verdis Frederickson weekly class is also offered for people with Parkinson's  To observe a class or for more information, call [phone] or email Hezzie Bump at [email or web address]   ADDITIONAL SUPPORT AND RESOURCES  Well[employer]Spring [employer]:Online Caregiver Education Opportunities:  www.well-[email or web address]/caregiver-education/caregiver-support-group.  You may also contact Vickki Muff at [email or web address] or [phone].     Well-Spring Navigator:  Just1Navigator program, a?free service to help individuals and families through the journey of determining care for older adults.  The "Navigator" is a [phone], Education officer, museum, who will speak with a prospective client and/or loved ones to provide an assessment of the situation and a set of recommendations for a personalized care plan -- all free of charge, and whether?Well[employer]Spring [employer] offers the needed service or not. If the need is not a service we provide, we are well-connected with reputable programs in town that we can refer you to.  www.well-springsolutions.org or to speak with the Navigator, call [phone].

## 2022-06-11 ENCOUNTER — Telehealth: Payer: Self-pay | Admitting: *Deleted

## 2022-06-11 ENCOUNTER — Ambulatory Visit (INDEPENDENT_AMBULATORY_CARE_PROVIDER_SITE_OTHER): Payer: 59

## 2022-06-11 ENCOUNTER — Encounter: Payer: Self-pay | Admitting: Podiatry

## 2022-06-11 ENCOUNTER — Ambulatory Visit (INDEPENDENT_AMBULATORY_CARE_PROVIDER_SITE_OTHER): Payer: 59 | Admitting: Podiatry

## 2022-06-11 ENCOUNTER — Telehealth: Payer: Self-pay

## 2022-06-11 DIAGNOSIS — M7750 Other enthesopathy of unspecified foot: Secondary | ICD-10-CM | POA: Diagnosis not present

## 2022-06-11 DIAGNOSIS — M7671 Peroneal tendinitis, right leg: Secondary | ICD-10-CM

## 2022-06-11 DIAGNOSIS — D1801 Hemangioma of skin and subcutaneous tissue: Secondary | ICD-10-CM | POA: Insufficient documentation

## 2022-06-11 DIAGNOSIS — Z808 Family history of malignant neoplasm of other organs or systems: Secondary | ICD-10-CM | POA: Insufficient documentation

## 2022-06-11 DIAGNOSIS — L821 Other seborrheic keratosis: Secondary | ICD-10-CM | POA: Insufficient documentation

## 2022-06-11 DIAGNOSIS — Z86018 Personal history of other benign neoplasm: Secondary | ICD-10-CM | POA: Insufficient documentation

## 2022-06-11 DIAGNOSIS — N951 Menopausal and female climacteric states: Secondary | ICD-10-CM | POA: Insufficient documentation

## 2022-06-11 DIAGNOSIS — D225 Melanocytic nevi of trunk: Secondary | ICD-10-CM | POA: Insufficient documentation

## 2022-06-11 DIAGNOSIS — D2239 Melanocytic nevi of other parts of face: Secondary | ICD-10-CM | POA: Insufficient documentation

## 2022-06-11 MED ORDER — MELOXICAM 15 MG PO TABS: 15.0000 mg | ORAL_TABLET | Freq: Every day | ORAL | 3 refills | Status: DC

## 2022-06-11 NOTE — Telephone Encounter (Signed)
Noted, thanks!

## 2022-06-11 NOTE — Patient Instructions (Addendum)
Look for Voltaren gel at the pharmacy over the counter or online (also known as diclofenac 1% gel). Apply to the painful areas 3-4x daily with the supplied dosing card. Allow to dry for 10 minutes before going into socks/shoes   If this is not better in 6-8 weeks please let me know  Call [phone] to schedule physical therapy   Peroneal Tendinopathy Rehab Ask your health care provider which exercises are safe for you. Do exercises exactly as told by your health care provider and adjust them as directed. It is normal to feel mild stretching, pulling, tightness, or discomfort as you do these exercises. Stop right away if you feel sudden pain or your pain gets worse. Do not begin these exercises until told by your health care provider. Stretching and range-of-motion exercises These exercises warm up your muscles and joints and improve the movement and flexibility of your ankle. These exercises also help to relieve pain and stiffness. Gastroc and soleus stretch, standing  This is an exercise in which you stand on a step and use your body weight to stretch your calf muscles. To do this exercise: Stand on the edge of a step on the ball of your left / right foot. The ball of your foot is on the walking surface, right under your toes. Keep your other foot firmly on the same step. Hold on to the wall, a railing, or a chair for balance. Slowly lift your other foot, allowing your body weight to press your left / right heel down over the edge of the step. You should feel a stretch in your left / right calf (gastrocnemius and soleus). Hold this position for 15 seconds. Return both feet to the step. Repeat this exercise with a slight bend in your left / right knee. Repeat 5 times with your left / right knee straight and 5 times with your left / right knee bent. Complete this exercise 2 times a day. Strengthening exercises These exercises build strength and endurance in your foot and ankle. Endurance is  the ability to use your muscles for a long time, even after they get tired. Ankle dorsiflexion with band   Secure a rubber exercise band or tube to an object, such as a table leg, that will not move when the band is pulled. Secure the other end of the band around your left / right foot. Sit on the floor, facing the object with your left / right leg extended. The band or tube should be slightly tense when your foot is relaxed. Slowly flex your left / right ankle and toes to bring your foot toward you (dorsiflexion). Hold this position for 15 seconds. Let the band or tube slowly pull your foot back to the starting position. Repeat 5 times. Complete this exercise 2 times a day. Ankle eversion Sit on the floor with your legs straight out in front of you. Loop a rubber exercise band or tube around the ball of your left / right foot. The ball of your foot is on the walking surface, right under your toes. Hold the ends of the band in your hands, or secure the band to a stable object. The band or tube should be slightly tense when your foot is relaxed. Slowly push your foot outward, away from your other leg (eversion). Hold this position for 15 seconds. Slowly return your foot to the starting position. Repeat 5 times. Complete this exercise 2 times a day. Plantar flexion, standing  This exercise is sometimes called  standing heel raise. Stand with your feet shoulder-width apart. Place your hands on a wall or table to steady yourself as needed, but try not to use it for support. Keep your weight spread evenly over the width of your feet while you slowly rise up on your toes (plantar flexion). If told by your health care provider: Shift your weight toward your left / right leg until you feel challenged. Stand on your left / right leg only. Hold this position for 15 seconds. Repeat 2 times. Complete this exercise 2 times a day. Single leg stand Without shoes, stand near a railing or in a doorway.  You may hold on to the railing or door frame as needed. Stand on your left / right foot. Keep your big toe down on the floor and try to keep your arch lifted. Do not roll to the outside of your foot. If this exercise is too easy, you can try it with your eyes closed or while standing on a pillow. Hold this position for 15 seconds. Repeat 5 times. Complete this exercise 2 times a day. This information is not intended to replace advice given to you by your health care provider. Make sure you discuss any questions you have with your health care provider. Document Revised: 07/26/2018 Document Reviewed: 07/26/2018 Elsevier Patient Education  [place].

## 2022-06-11 NOTE — Progress Notes (Signed)
  Subjective:  Patient ID: Martha Phillips, female    DOB: [birth year],  MRN: [id number]  Chief Complaint  Patient presents with   Foot Pain    Achilles and lateral ankle right - burning, aching x 2 weeks, no injury, swelling, tried a compression sleeve for support    62 y.o. female presents with the above complaint. History confirmed with patient.   Objective:  Physical Exam: warm, good capillary refill, no trophic changes or ulcerative lesions, normal DP and PT pulses, normal sensory exam, and pain and tenderness along the peroneus brevis tendon distal to the malleolus, worse with resisted eversion.   Radiographs: Multiple views x-ray of right ankle: no fracture, dislocation, swelling or degenerative changes noted Assessment:   1. Peroneal tendinitis of right lower extremity      Plan:  Patient was evaluated and treated and all questions answered.  Discussed the etiology and treatment options for peroneal tendinitis including stretching, formal physical therapy with an eccentric exercises therapy plan, supportive shoegears such as a running shoe or sneaker, bracing and immobilization, topical and oral medications.  We also discussed that I do not routinely perform injections in this area because of the risk of an increased damage or rupture of the tendon.  We also discussed the role of surgical treatment of this for patients who do not improve after exhausting non-surgical treatment options.  -XR reviewed with patient -Educated on stretching and icing of the affected limb. -Referral placed to physical therapy at [facility] in [place]. -Rx for Meloxicam. Advised on risks, benefits, and alternatives of the medication -Tri-Lock ankle brace was dispensed for support immobilization of the joint and tendon -Recommend topical Voltaren gel  No follow-ups on file.

## 2022-06-11 NOTE — Telephone Encounter (Signed)
Patient is calling for status of a oral medication,she said that it started with an M,was mentioned during visit, (not seeing in epic notes).please advise/ send to pharmacy on file.

## 2022-06-26 NOTE — Telephone Encounter (Signed)
[facility] stated that they did not receive the referral for this patient. Is there away someone can get the referral to them today.   Thank you

## 2022-06-29 ENCOUNTER — Other Ambulatory Visit: Payer: Self-pay

## 2022-06-29 DIAGNOSIS — M7671 Peroneal tendinitis, right leg: Secondary | ICD-10-CM

## 2022-06-29 NOTE — Telephone Encounter (Signed)
Referral resent with DX code and instructions was sent today.

## 2022-10-01 ENCOUNTER — Telehealth: Payer: Self-pay | Admitting: Podiatry

## 2022-10-01 ENCOUNTER — Ambulatory Visit (INDEPENDENT_AMBULATORY_CARE_PROVIDER_SITE_OTHER): Payer: 59

## 2022-10-01 ENCOUNTER — Encounter: Payer: Self-pay | Admitting: Podiatry

## 2022-10-01 ENCOUNTER — Ambulatory Visit (INDEPENDENT_AMBULATORY_CARE_PROVIDER_SITE_OTHER): Payer: 59 | Admitting: Podiatry

## 2022-10-01 DIAGNOSIS — M7671 Peroneal tendinitis, right leg: Secondary | ICD-10-CM

## 2022-10-01 DIAGNOSIS — S93331A Other subluxation of right foot, initial encounter: Secondary | ICD-10-CM | POA: Diagnosis not present

## 2022-10-01 DIAGNOSIS — Q6671 Congenital pes cavus, right foot: Secondary | ICD-10-CM

## 2022-10-01 NOTE — Patient Instructions (Signed)
Call [facility] to schedule your MRI at the below locations.  Please allow at least 1 business day after your visit to process the referral.  It may take longer depending on approval from insurance.  Please let me know if you have issues or problems scheduling the MRI   [facility] [phone] [address] [place], [place] [address]  [facility] [phone] [address] [place], [place] [address]  

## 2022-10-01 NOTE — Telephone Encounter (Signed)
Patient called to ask if it's the meloxicam that Dr. Lilian Kapur was referring to when he recommended continuing her anti-inflammatory.  She was informed that was correct and she had recently gotten a refill approved, so has enough for another 30 days.  She will take one tablet daily.   No further action needed at this time.

## 2022-10-01 NOTE — Progress Notes (Signed)
  Subjective:  Patient ID: Martha Phillips, female    DOB: [birth year],  MRN: [id number]  Chief Complaint  Patient presents with   Foot Pain    Patient came in today for right foot pain, lateral side of the foot and back of the heel, patient heard a pop  a week ago, X-Rays taking today     62 y.o. female presents with the above complaint. History confirmed with patient.  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  Recently also stepped into some sand and felt a pop and then a second pop later.  First was in the plantar mid arch second was in the outside of the ankle.  No bruising.  Has been able to ambulate on it.  The Tri-Lock was intolerable and she has been using a compression type sleeve  Objective:  Physical Exam: warm, good capillary refill, no trophic changes or ulcerative lesions, normal DP and PT pulses, normal sensory exam, and pain and tenderness along the peroneus brevis tendon distal to the malleolus, worse with resisted eversion, she now has some pain in the retromalleolar groove along peroneal retinacula, no pain in the arch, no subluxation with circumduction of the foot on the ankle.   Radiographs: Multiple views x-ray of the right foot show no fractures or dislocations or avulsions Assessment:   1. Peroneal tendinitis of right lower extremity   2. Subluxation of peroneal tendon of right foot      Plan:  Patient was evaluated and treated and all questions answered.  Unfortunate has not had much progress and has had reinjury.  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  Has not been able to tolerate bracing.  I would expect at this point with 8 weeks of therapy she would have quite a bit of improvement.  I would recommend an MRI at this point to determine if there is any peroneal retinacular tear or split tearing within the peroneus brevis or longus tendons.  This  has been ordered.  May continue exercising.  Letter given to work from home now primarily.  If only synovitis or very small tearing could consider corticosteroid or PRP injection and immobilization in CAM boot and continued therapy, if there is a large tear or retinacular tear she may need surgical correction.  She will use a MalleoTrain type brace that I recommended she will purchase online.  Return in about 6 weeks (around 11/12/2022) for re-check peroneal tendinitis, after MRI to review.

## 2022-10-06 ENCOUNTER — Encounter: Payer: Self-pay | Admitting: Podiatry

## 2022-10-08 ENCOUNTER — Ambulatory Visit
Admission: RE | Admit: 2022-10-08 | Discharge: 2022-10-08 | Disposition: A | Discharge status: Home / Self Care | Payer: 59 | Source: Ambulatory Visit | Attending: Podiatry | Admitting: Podiatry

## 2022-10-08 DIAGNOSIS — M7671 Peroneal tendinitis, right leg: Secondary | ICD-10-CM

## 2022-10-08 DIAGNOSIS — S93331A Other subluxation of right foot, initial encounter: Secondary | ICD-10-CM

## 2022-10-18 ENCOUNTER — Other Ambulatory Visit: Payer: Self-pay | Admitting: Podiatry

## 2022-11-04 NOTE — Progress Notes (Signed)
Assessment/Plan:   1.  Parkinsons Disease with mild dyskinesia (not bothersome), diagnosed July, 2022  -continue carbidopa/levodopa 25/100 tid (7am/11am/4pm)  -follows with Dr. Emily Filbert for dermatology   2.  GAD  -on lexapro.  GYN prescribing.      Subjective:   Martha Phillips was seen today in follow up for Parkinsons disease, diagnosed last visit.  My previous records were reviewed prior to todays visit as well as outside records available to me.  Pt denies falls.  Pt denies lightheadedness, near syncope.  No hallucinations.  Mood has been good.   She states that she was dx with peroneal tendonitis on the R (no trauma).  She now has a subluxation on the peroneal tendon on the R.  She is anticipating PT soon but not surgical intervention.  She has noted more tremor with this thing and it was the only time she had tremor on the R(increased pain and fatigue).  Because of all of this, she has not been able to exercise.     Current prescribed movement disorder medications: Carbidopa/levodopa 25/100, 1 tablet 3 times per day    PREVIOUS MEDICATIONS: Sinemet  ALLERGIES:   Allergies  Allergen Reactions   Penicillins Anaphylaxis   Formaldehyde Hives, Itching, Swelling, Dermatitis and Rash    Skin irritations   Gold Sodium Thiomalate Hives, Itching, Swelling, Dermatitis and Rash    Skin irritation   Lanolin Hives, Itching, Swelling, Dermatitis and Rash   Latex Hives, Itching, Swelling, Dermatitis and Rash    skin irritation    CURRENT MEDICATIONS:  Outpatient Encounter Medications as of 11/09/2022  Medication Sig   aspirin (ASPIRIN LOW DOSE) 81 MG chewable tablet Chew 81 mg by mouth daily.   cetirizine (ZYRTEC) 10 MG tablet Take 10 mg by mouth daily.   clobetasol ointment (TEMOVATE) 0.05 % Apply 1 Application topically 2 (two) times daily.   Coenzyme Q10 (CO Q 10) 10 MG CAPS Take 1 tablet by mouth daily at 6 (six) AM.   [employer] SUNSCREEN SPF 30 EX Apply 1 application  topically  daily at 6 (six) AM.   escitalopram (LEXAPRO) 10 MG tablet Take 10 mg by mouth daily.   estradiol (ESTRACE) 1 MG tablet estradiol 1 mg tablet  TAKE 1 TABLET BY MOUTH EVERY DAY   medroxyPROGESTERone (PROVERA) 5 MG tablet medroxyprogesterone 5 mg tablet  TAKE 1 TABLET BY MOUTH EVERY DAY   meloxicam (MOBIC) 15 MG tablet TAKE 1 TABLET BY MOUTH EVERY DAY   Multiple Vitamin (MULTIVITAMIN) capsule Take 1 capsule by mouth daily.   simvastatin (ZOCOR) 10 MG tablet Take 10 mg by mouth at bedtime.   [DISCONTINUED] carbidopa-levodopa (SINEMET IR) 25-100 MG tablet Take 1 tablet by mouth 3 (three) times daily. 7am/11am/4pm   carbidopa-levodopa (SINEMET IR) 25-100 MG tablet Take 1 tablet by mouth 3 (three) times daily. 7am/11am/4pm   Facility-Administered Encounter Medications as of 11/09/2022  Medication   0.9 %  sodium chloride infusion    Objective:   PHYSICAL EXAMINATION:    VITALS:   Vitals:   11/09/22 0755  BP: 126/84  Pulse: 89  SpO2: 98%  Weight: 205 lb 12.8 oz (93.4 kg)    GEN:  The patient appears stated age and is in NAD. HEENT:  Normocephalic, atraumatic.  The mucous membranes are moist. The superficial temporal arteries are without ropiness or tenderness. CV:  RRR Lungs:  CTAB   Neurological examination:  Orientation: The patient is alert and oriented x3. Cranial nerves: There is good  facial symmetry with min facial hypomimia. The speech is fluent and clear. Soft palate rises symmetrically and there is no tongue deviation. Hearing is intact to conversational tone. Sensation: Sensation is intact to light touch throughout Motor: Strength is at least antigravity x4.  Movement examination: Tone: There is nl tone in the UE/LE Abnormal movements: there is no tremor.  There is axial/head dyskinesia only with distraction (same as previous) Coordination:  There is decremation only with finger taps on the R Gait and Station: The patient has no difficulty arising out of a deep-seated  chair without the use of the hands. The patient's stride length is good.      Cc:  Elfredia Nevins, MD

## 2022-11-09 ENCOUNTER — Ambulatory Visit (INDEPENDENT_AMBULATORY_CARE_PROVIDER_SITE_OTHER): Payer: 59 | Admitting: Neurology

## 2022-11-09 ENCOUNTER — Encounter: Payer: Self-pay | Admitting: Neurology

## 2022-11-09 VITALS — BP 126/84 | HR 89 | Wt 205.8 lb

## 2022-11-09 DIAGNOSIS — G20B1 Parkinson's disease with dyskinesia, without mention of fluctuations: Secondary | ICD-10-CM

## 2022-11-09 MED ORDER — CARBIDOPA-LEVODOPA 25-100 MG PO TABS: 1.0000 | ORAL_TABLET | Freq: Three times a day (TID) | ORAL | 2 refills | Status: DC

## 2022-11-09 NOTE — Patient Instructions (Signed)
 SAVE THE DATE!  We are planning a Parkinsons Disease educational symposium at [facility] in [place] on October 11.  More details to come!  If you would like to be added to our email list to get further information, email [email or web address].  We hope to see you there!

## 2022-11-12 ENCOUNTER — Ambulatory Visit: Payer: 59 | Admitting: Podiatry

## 2023-01-07 ENCOUNTER — Other Ambulatory Visit: Payer: Self-pay | Admitting: Obstetrics and Gynecology

## 2023-01-07 DIAGNOSIS — Z803 Family history of malignant neoplasm of breast: Secondary | ICD-10-CM

## 2023-02-13 ENCOUNTER — Ambulatory Visit
Admission: RE | Admit: 2023-02-13 | Discharge: 2023-02-13 | Disposition: A | Discharge status: Home / Self Care | Payer: 59 | Source: Ambulatory Visit | Attending: Obstetrics and Gynecology | Admitting: Obstetrics and Gynecology

## 2023-02-13 DIAGNOSIS — Z803 Family history of malignant neoplasm of breast: Secondary | ICD-10-CM

## 2023-02-13 MED ORDER — GADOPICLENOL 0.5 MMOL/ML IV SOLN
9.0000 mL | Freq: Once | INTRAVENOUS | Status: AC | PRN
  Administered 2023-02-13: 9 mL via INTRAVENOUS

## 2023-02-25 ENCOUNTER — Other Ambulatory Visit: Payer: Self-pay | Admitting: Podiatry

## 2023-05-12 ENCOUNTER — Ambulatory Visit: Payer: 59 | Admitting: Neurology

## 2023-05-17 NOTE — Progress Notes (Unsigned)
Assessment/Plan:   1.  Parkinsons Disease with mild dyskinesia (not bothersome), diagnosed July, 2022  -continue carbidopa/levodopa 25/100 tid (7am/11am/4pm)  -follows with Dr. Emily Filbert for dermatology   2.  GAD  -on lexapro.  GYN prescribing.   Subjective:   Martha Phillips was seen today in follow up for Parkinsons disease, diagnosed last visit.  My previous records were reviewed prior to todays visit as well as outside records available to me.  Pt continues to do well from a Parkinson's standpoint.  She has had no falls.  No lightheadedness or near syncope.  No hallucinations.   Current prescribed movement disorder medications: Carbidopa/levodopa 25/100, 1 tablet 3 times per day    PREVIOUS MEDICATIONS: Sinemet  ALLERGIES:   Allergies  Allergen Reactions   Penicillins Anaphylaxis   Formaldehyde Hives, Itching, Swelling, Dermatitis and Rash    Skin irritations   Gold Sodium Thiomalate Hives, Itching, Swelling, Dermatitis and Rash    Skin irritation   Lanolin Hives, Itching, Swelling, Dermatitis and Rash   Latex Hives, Itching, Swelling, Dermatitis and Rash    skin irritation    CURRENT MEDICATIONS:  Outpatient Encounter Medications as of 05/18/2023  Medication Sig   aspirin (ASPIRIN LOW DOSE) 81 MG chewable tablet Chew 81 mg by mouth daily.   carbidopa-levodopa (SINEMET IR) 25-100 MG tablet Take 1 tablet by mouth 3 (three) times daily. 7am/11am/4pm   cetirizine (ZYRTEC) 10 MG tablet Take 10 mg by mouth daily.   clobetasol ointment (TEMOVATE) 0.05 % Apply 1 Application topically 2 (two) times daily.   Coenzyme Q10 (CO Q 10) 10 MG CAPS Take 1 tablet by mouth daily at 6 (six) AM.   [employer]S SUNSCREEN SPF 30 EX Apply 1 application  topically daily at 6 (six) AM.   escitalopram (LEXAPRO) 10 MG tablet Take 10 mg by mouth daily.   estradiol (ESTRACE) 1 MG tablet estradiol 1 mg tablet  TAKE 1 TABLET BY MOUTH EVERY DAY   medroxyPROGESTERone (PROVERA) 5 MG tablet  medroxyprogesterone 5 mg tablet  TAKE 1 TABLET BY MOUTH EVERY DAY   meloxicam (MOBIC) 15 MG tablet TAKE 1 TABLET BY MOUTH EVERY DAY   Multiple Vitamin (MULTIVITAMIN) capsule Take 1 capsule by mouth daily.   simvastatin (ZOCOR) 10 MG tablet Take 10 mg by mouth at bedtime.   Facility-Administered Encounter Medications as of 05/18/2023  Medication   0.9 %  sodium chloride infusion    Objective:   PHYSICAL EXAMINATION:    VITALS:   There were no vitals filed for this visit.   GEN:  The patient appears stated age and is in NAD. HEENT:  Normocephalic, atraumatic.  The mucous membranes are moist. The superficial temporal arteries are without ropiness or tenderness. CV:  RRR Lungs:  CTAB   Neurological examination:  Orientation: The patient is alert and oriented x3. Cranial nerves: There is good facial symmetry with min facial hypomimia. The speech is fluent and clear. Soft palate rises symmetrically and there is no tongue deviation. Hearing is intact to conversational tone. Sensation: Sensation is intact to light touch throughout Motor: Strength is at least antigravity x4.  Movement examination: Tone: There is nl tone in the UE/LE Abnormal movements: there is no tremor.  There is axial/head dyskinesia only with distraction (same as previous) Coordination:  There is decremation only with finger taps on the R Gait and Station: The patient has no difficulty arising out of a deep-seated chair without the use of the hands. The patient's stride length is  good.    Total time spent on today's visit was *** minutes, including both face-to-face time and nonface-to-face time.  Time included that spent on review of records (prior notes available to me/labs/imaging if pertinent), discussing treatment and goals, answering patient's questions and coordinating care.   Cc:  Elfredia Nevins, MD

## 2023-05-18 ENCOUNTER — Encounter: Payer: Self-pay | Admitting: Neurology

## 2023-05-18 ENCOUNTER — Ambulatory Visit (INDEPENDENT_AMBULATORY_CARE_PROVIDER_SITE_OTHER): Payer: 59 | Admitting: Neurology

## 2023-05-18 VITALS — BP 124/80 | HR 67 | Wt 201.0 lb

## 2023-05-18 DIAGNOSIS — G20B1 Parkinson's disease with dyskinesia, without mention of fluctuations: Secondary | ICD-10-CM | POA: Diagnosis not present

## 2023-05-18 MED ORDER — CARBIDOPA-LEVODOPA 25-100 MG PO TABS: 1.0000 | ORAL_TABLET | Freq: Three times a day (TID) | ORAL | 1 refills | Status: DC

## 2023-05-18 NOTE — Patient Instructions (Signed)
Look into the Parkinsons fight club on Archivist for Power over Parkinson's Group?  January 2025 ?  LOCAL [place] PARKINSON'S GROUPS??  Power over Parkinson's Group:???  Upcoming Power over [employer] Meetings/Care Partner Support:? 2nd Wednesdays of the month at 2 pm:  January 8th, February 12th Contact Lynwood Dawley at [email or web address] or Amy Marriott at [email or web address] if interested in participating in this group?  ?  LOCAL EVENTS AND NEW OFFERINGS?  Dance Project Spring 2025:  January 14-May 20, Tuesdays 10-11 am.  All details on website: [email or web address].is ACT FITNESS Chair Yoga classes "Train and Gain", Fridays 10 am, [employer].  Contact Gina at [phone].   PWR! Moves [place] class!  Wednesdays at 10 am.  Please contact Lonia Blood, PT at [email or web address] if interested. Health visitor Classes offering at [employer]!? Tuesdays (Chair Yoga)  and Wednesdays (PWR! Moves)  1:00 pm.?? Contact Aldona Lento [phone] or [email or web address] Drumming for Parkinson's will be held on 2nd and 4th Mondays at 11:00 am.?? Located at the [place] of the [place] ([address] [place]. [place].) *Next class is January 13th.? Contact Albertina Parr at [email or web address] or [phone]?  [facility] Parkinson's Tai Chi Class, Mondays at 11 am.  Call [phone] for details  [facility] at [facility]- [address], [place] Wednesdays- 4:00 - 5:00 PM - specifically for Parkinson's Disease.  Free!  Contact Denny Peon, [place] - [phone] (clinic) or  [phone] (cell) or by email: [email or web address]   ?ONLINE EDUCATION AND SUPPORT?  Parkinson Foundation:? www.parkinson.org?  PD Health at Home continues:? Mindfulness Mondays, Wellness Wednesdays, Fitness Fridays??  Upcoming  Education:??  Empowerment through Movement, Wednesday, January 15th, 1-2 pm A Deep Dive into Deep Brain Stimulation (DBS), Wednesday, January 29th, 1-2 pm Expert Briefing:    Stay tuned Register for virtual education and expert briefings (webinars) at ElectroFunds.gl  Please check out their website to sign up for emails and see their full online offerings??  ?  [employer] Foundation:? www.michaeljfox.org??  Third Thursday Webinars:? On the third Thursday of every month at 12 p.m. ET, join our free live webinars to learn about various aspects of living with Parkinson's disease and our work to speed medical breakthroughs.?  Upcoming Webinar:? Managing the Hidden Symptoms:  Mood and Motivation Changes in Parkinson's.  Thursday, January 16th at 12 noon.  Check out additional information on their website to see their full online offerings?  ?  [employer]:? www.[email or web address].org?  Upcoming Webinar:   Stay tuned Series:? Living with Parkinson's Meetup.?? Third Thursdays each month, 3 pm?  Care Partner Monthly Meetup.? With Jillene Bucks [place].? First Tuesday of each month, 2 pm?  Check out additional information to Live Well Today on their website?  ?  Parkinson and Movement Disorders (PMD) Alliance:? www.pmdalliance.org?  NeuroLife Online:? Online Education Events?  Sign up for emails, which are sent weekly to give you updates on programming and online offerings?  ?  Parkinson's Association of the [place]:? www.parkinsonassociation.org?  Information on online support groups, education events, and online exercises including Yoga, Parkinson's exercises and more-LOTS of information on links to PD resources and online events?  Virtual Support Group through [employer] of the [place]-First Wednesday of each month at 2 pm   MOVEMENT AND EXERCISE OPPORTUNITIES?  PWR! Moves [place] class has returned!  Wednesdays  at 10 am.  Please contact Lonia Blood, PT at [email or web address] if interested. Parkinson's Exercise Class offerings at [employer].  Tuesdays (Chair yoga) and Wednesdays (PWR! Moves)  1:00 pm.?  Contact Aldona Lento [phone] or [email or web address]  Parkinson's Wellness Recovery (PWR! Moves)? www.pwr4life.org?  Info on the PWR! Virtual Experience:? You will have access to our expertise?through self-assessment, guided plans that start with the PD-specific fundamentals, educational content, tips, Q&A with an expert, and a growing Engineering geologist of PD-specific pre-recorded and live exercise classes of varying types and intensity - both physical and cognitive! If that is not enough, we offer 1:1 wellness consultations (in-person or virtual) to personalize your PWR! Dance movement psychotherapist.??  Parkinson [employer] Fridays:??  As part of the PD Health @ Home program, this free video series focuses each week on one aspect of fitness designed to support people living with Parkinson's.? These weekly videos highlight the Parkinson Foundation fitness guidelines for people with Parkinson's disease.?  MenusLocal.com.br?  Dance for PD website is offering free, live-stream classes throughout the week, as well as links to [employer] of classes:? https://danceforparkinsons.org/?  Virtual dance and Pilates for Parkinson's classes: Click on the Community Tab> Parkinson's Movement Initiative Tab.? To register for classes and for more information, visit www.[email or web address].co.za and click the "community" tab.??  [employer] Parkinson's Cycling Classes??  [facility]:? Thursdays @ Noon-Live classes at [employer] ([employer] at [email or web address]?or [phone])?  [facility]: Classes Tuesday, Wednesday and Thursday (contact [place] at [email or web address] ?or [phone])?  [place] [employer]?  Varied  levels of classes are offered Tuesdays and Thursdays at [facility].??  Stretching with Byrd Hesselbach weekly class is also offered for people with Parkinson's?  To observe a class or for more information, call [phone] or email Patricia Nettle at [email or web address]?    ADDITIONAL SUPPORT AND RESOURCES?  Well[employer]Spring [employer]:  Chiropractor:? www.well-springsolutions.org/caregiver-education/caregiver-support-group.? You may also contact Loleta Chance at [facility] -spring.org or [phone].????  Well-Spring Navigator:? Just1Navigator program, a?free service to help individuals and families through the journey of determining care for older adults.? The "Navigator" is a Child psychotherapist, Sidney Ace, who will speak with a prospective client and/or loved ones to provide an assessment of the situation and a set of recommendations for a personalized care plan -- all free of charge, and whether?Well-Spring [employer] offers the needed service or not. If the need is not a service we provide, we are well-connected with reputable programs in town that we can refer you to.? www.well-springsolutions.org or to speak with the Navigator, call [phone].?

## 2023-11-12 NOTE — Progress Notes (Signed)
 Assessment/Plan:   1.  Parkinsons Disease with mild dyskinesia (not bothersome), diagnosed July, 2022  -continue carbidopa /levodopa  25/100 tid (7am/11am/4pm).  She sometimes takes extra prn.  I adjusted the RX for that  -she asks about nature of dyskinesia today and discussed that.  -follows with Dr. Robinson for dermatology  -part of the balance issue is a peroneal m tear unrelated to Parkinsons Disease    2.  GAD  -on lexapro.  GYN prescribing.  -right now, its more adjustment d/o - mom passed, caregiving for sister, work is stressful. Discussed counseling and she accepted and we will refer.  3.  Her PCP is leaving so I gave her information to new pcp   Subjective:   DWANNA Phillips was seen today in follow up for Parkinsons disease, diagnosed last visit.  My previous records were reviewed prior to todays visit as well as outside records available to me.  Patient is doing well on low-dose levodopa .  She has had one fall - she got her foot caught when going up the outside brick stairs and fell.  No lightheadedness or near syncope.  Mood has been fair; her mom passed in March which is stressful.  Her sister also had a stroke and she is caregiving.  Work has also been stressful.  Her balance has been off due to a peroneal m tear.  She asks about anxiety association with Parkinsons Disease.    Current prescribed movement disorder medications: Carbidopa /levodopa  25/100, 1 tablet 3 times per day    PREVIOUS MEDICATIONS: Sinemet   ALLERGIES:   Allergies  Allergen Reactions   Penicillins Anaphylaxis   Formaldehyde Hives, Itching, Swelling, Dermatitis and Rash    Skin irritations   Gold Sodium Thiomalate Hives, Itching, Swelling, Dermatitis and Rash    Skin irritation   Lanolin Hives, Itching, Swelling, Dermatitis and Rash   Latex Hives, Itching, Swelling, Dermatitis and Rash    skin irritation    CURRENT MEDICATIONS:  Outpatient Encounter Medications as of 11/16/2023  Medication  Sig   carbidopa -levodopa  (SINEMET  IR) 25-100 MG tablet Take 1 tablet by mouth 3 (three) times daily. 7am/11am/4pm   cetirizine (ZYRTEC) 10 MG tablet Take 10 mg by mouth daily.   clobetasol ointment (TEMOVATE) 0.05 % Apply 1 Application topically 2 (two) times daily.   [employer]S SUNSCREEN SPF 30 EX Apply 1 application  topically daily at 6 (six) AM.   escitalopram (LEXAPRO) 10 MG tablet Take 10 mg by mouth daily.   estradiol (ESTRACE) 1 MG tablet estradiol 1 mg tablet  TAKE 1 TABLET BY MOUTH EVERY DAY   medroxyPROGESTERone (PROVERA) 5 MG tablet medroxyprogesterone 5 mg tablet  TAKE 1 TABLET BY MOUTH EVERY DAY   meloxicam  (MOBIC ) 15 MG tablet TAKE 1 TABLET BY MOUTH EVERY DAY   Multiple Vitamin (MULTIVITAMIN) capsule Take 1 capsule by mouth daily.   Facility-Administered Encounter Medications as of 11/16/2023  Medication   0.9 %  sodium chloride  infusion    Objective:   PHYSICAL EXAMINATION:    VITALS:   Vitals:   11/16/23 0753  BP: 128/78  Pulse: 85  SpO2: 96%  Weight: 198 lb 6.4 oz (90 kg)  Height: 5' 5.5 (1.664 m)   Wt Readings from Last 3 Encounters:  11/16/23 198 lb 6.4 oz (90 kg)  05/18/23 201 lb (91.2 kg)  11/09/22 205 lb 12.8 oz (93.4 kg)     GEN:  The patient appears stated age and is in NAD. HEENT:  Normocephalic, atraumatic.  The mucous  membranes are moist. The superficial temporal arteries are without ropiness or tenderness.  Neurological examination:  Orientation: The patient is alert and oriented x3. Cranial nerves: There is good facial symmetry with min facial hypomimia. The speech is fluent and clear. Soft palate rises symmetrically and there is no tongue deviation. Hearing is intact to conversational tone. Sensation: Sensation is intact to light touch throughout Motor: Strength is at least antigravity x4.  Movement examination: Tone: There is nl tone in the UE/LE Abnormal movements: there is no tremor.  There is mild dyskinesia in the R hand and  head Coordination:  There is mild decremation with finger taps on the R.  All other RAMs are nl.  There is motor overflow/persistence to the mouth Gait and Station: The patient has no difficulty arising out of a deep-seated chair without the use of the hands. The patient's stride length is good.    Total time spent on today's visit was 35 minutes, including both face-to-face time and nonface-to-face time.  Time included that spent on review of records (prior notes available to me/labs/imaging if pertinent), discussing treatment and goals, answering patient's questions and coordinating care.   Cc:  Bertell Satterfield, MD

## 2023-11-16 ENCOUNTER — Encounter: Payer: Self-pay | Admitting: Neurology

## 2023-11-16 ENCOUNTER — Other Ambulatory Visit: Payer: Self-pay

## 2023-11-16 ENCOUNTER — Ambulatory Visit (INDEPENDENT_AMBULATORY_CARE_PROVIDER_SITE_OTHER): Payer: 59 | Admitting: Neurology

## 2023-11-16 VITALS — BP 128/78 | HR 85 | Ht 65.5 in | Wt 198.4 lb

## 2023-11-16 DIAGNOSIS — F4322 Adjustment disorder with anxiety: Secondary | ICD-10-CM

## 2023-11-16 DIAGNOSIS — G20B1 Parkinson's disease with dyskinesia, without mention of fluctuations: Secondary | ICD-10-CM | POA: Diagnosis not present

## 2023-11-16 DIAGNOSIS — F419 Anxiety disorder, unspecified: Secondary | ICD-10-CM

## 2023-11-16 MED ORDER — CARBIDOPA-LEVODOPA 25-100 MG PO TABS: ORAL_TABLET | ORAL | 1 refills | Status: DC

## 2023-11-16 NOTE — Patient Instructions (Addendum)
 Look at cost plus drugs for your RX We will refer to psychology Dr. Jacqulyn Ahle Address: [address] [place], [place] [address] Phone: [phone]  SAVE THE DATE!  We are planning a Parkinsons Disease educational symposium at [facility], [address] [place], [place], [place] [address] on September 19.  We will have a movement disorder physician expert from [employer] coming to speak and a caregiver speaker.  We will have a panel of experts that will show you who you may need on your team of people on your journey with Parkinsons.  I hope to see you there!  Use this QR code to register by scanning it with the camera app on your phone:      Need more help with registration?  Contact [email or web address]

## 2023-12-21 ENCOUNTER — Other Ambulatory Visit: Payer: Self-pay | Admitting: Obstetrics and Gynecology

## 2023-12-21 DIAGNOSIS — Z803 Family history of malignant neoplasm of breast: Secondary | ICD-10-CM

## 2024-01-16 ENCOUNTER — Ambulatory Visit
Admission: RE | Admit: 2024-01-16 | Discharge: 2024-01-16 | Disposition: A | Discharge status: Home / Self Care | Source: Ambulatory Visit | Attending: Obstetrics and Gynecology | Admitting: Obstetrics and Gynecology

## 2024-01-16 DIAGNOSIS — Z803 Family history of malignant neoplasm of breast: Secondary | ICD-10-CM

## 2024-01-16 MED ORDER — GADOPICLENOL 0.5 MMOL/ML IV SOLN
9.0000 mL | Freq: Once | INTRAVENOUS | Status: AC | PRN
  Administered 2024-01-16: 9 mL via INTRAVENOUS

## 2024-02-10 ENCOUNTER — Ambulatory Visit (INDEPENDENT_AMBULATORY_CARE_PROVIDER_SITE_OTHER): Admitting: Family Medicine

## 2024-02-10 ENCOUNTER — Encounter: Payer: Self-pay | Admitting: Family Medicine

## 2024-02-10 VITALS — BP 136/79 | HR 97 | Temp 98.6°F | Ht 66.0 in | Wt 196.0 lb

## 2024-02-10 DIAGNOSIS — Z130 Encounter for screening for diseases of the blood and blood-forming organs and certain disorders involving the immune mechanism: Secondary | ICD-10-CM

## 2024-02-10 DIAGNOSIS — N904 Leukoplakia of vulva: Secondary | ICD-10-CM | POA: Insufficient documentation

## 2024-02-10 DIAGNOSIS — G20B1 Parkinson's disease with dyskinesia, without mention of fluctuations: Secondary | ICD-10-CM

## 2024-02-10 DIAGNOSIS — M1712 Unilateral primary osteoarthritis, left knee: Secondary | ICD-10-CM | POA: Insufficient documentation

## 2024-02-10 DIAGNOSIS — Z13228 Encounter for screening for other metabolic disorders: Secondary | ICD-10-CM

## 2024-02-10 DIAGNOSIS — E785 Hyperlipidemia, unspecified: Secondary | ICD-10-CM | POA: Insufficient documentation

## 2024-02-10 DIAGNOSIS — F411 Generalized anxiety disorder: Secondary | ICD-10-CM | POA: Insufficient documentation

## 2024-02-10 NOTE — Assessment & Plan Note (Signed)
 Slowly improving.  Continue meloxicam .  We discussed using for shortest duration possible.

## 2024-02-10 NOTE — Assessment & Plan Note (Signed)
 Unsure of control.  Lipid panel today to assess.

## 2024-02-10 NOTE — Progress Notes (Signed)
 Subjective:  Patient ID: Martha Phillips, female    DOB: 12/26/1960  Age: 63 y.o. MRN: [id number]  CC:   Chief Complaint  Patient presents with   New Patient (Initial Visit)    HPI:  63 year old female presents to establish care.  Patient has past medical history of Parkinson's disease, osteoarthritis of the left knee, lichen sclerosis.  Over the past couple years she has had orthopedic issues.  She is currently having issues with left knee osteoarthritis.  She has had injections and states that she is slowly improving.  Is currently on meloxicam .  Patient endorses stability regarding Parkinson's at this time.  She is followed by Dr. Evonnie.  She states that she is trying to stay active.    In regards to her preventative health care, she is in need of shingles vaccine, pneumococcal vaccine, and tetanus.  She states that her Pap smear is up-to-date.  Need records of this.  Mammogram up-to-date.  Colonoscopy up-to-date.  Has a history of hyperlipidemia.  Mild per her report.  Need labs.  Patient Active Problem List   Diagnosis Date Noted   Osteoarthritis of left knee 02/10/2024   Lichen sclerosus of vulva 02/10/2024   GAD (generalized anxiety disorder) 02/10/2024   Hyperlipidemia 02/10/2024   Family history of melanoma 06/11/2022   History of dysplastic nevus 06/11/2022   Parkinson's disease (HCC) 11/04/2020    Social Hx   Social History   Socioeconomic History   Marital status: Divorced    Spouse name: Not on file   Number of children: Not on file   Years of education: Not on file   Highest education level: Bachelor's degree (e.g., BA, AB, BS)  Occupational History   Occupation: Event organiser: [employer]  Tobacco Use   Smoking status: Never   Smokeless tobacco: Never  Vaping Use   Vaping status: Never Used  Substance and Sexual Activity   Alcohol use: No   Drug use: No   Sexual activity: Never    Birth control/protection: Pill    Comment: Loestrin 24 cont.   cycle control  Other Topics Concern   Not on file  Social History Narrative   Right Handed   Lives in a one story home    Social Drivers of Health   Financial Resource Strain: Low Risk  (02/07/2024)   Overall Financial Resource Strain (CARDIA)    Difficulty of Paying Living Expenses: Not hard at all  Food Insecurity: No Food Insecurity (02/07/2024)   Hunger Vital Sign    Worried About Running Out of Food in the Last Year: Never true    Ran Out of Food in the Last Year: Never true  Transportation Needs: No Transportation Needs (02/07/2024)   PRAPARE - Administrator, Civil Service (Medical): No    Lack of Transportation (Non-Medical): No  Physical Activity: Inactive (02/07/2024)   Exercise Vital Sign    Days of Exercise per Week: 0 days    Minutes of Exercise per Session: Not on file  Stress: No Stress Concern Present (02/07/2024)   [employer] of Occupational Health - Occupational Stress Questionnaire    Feeling of Stress: Only a little  Social Connections: Moderately Integrated (02/07/2024)   Social Connection and Isolation Panel    Frequency of Communication with Friends and Family: More than three times a week    Frequency of Social Gatherings with Friends and Family: Twice a week    Attends Religious Services: More than 4  times per year    Active Member of Clubs or Organizations: Yes    Attends Banker Meetings: More than 4 times per year    Marital Status: Divorced    Review of Systems Per HPI  Objective:  BP 136/79   Pulse 97   Temp 98.6 F (37 C) (Temporal)   Ht 5' 6 (1.676 m)   Wt 196 lb (88.9 kg)   SpO2 98%   BMI 31.64 kg/m      02/10/2024    8:43 AM 11/16/2023    7:53 AM 05/18/2023    2:54 PM  BP/Weight  Systolic BP 136 128 124  Diastolic BP 79 78 80  Wt. (Lbs) 196 198.4 201  BMI 31.64 kg/m2 32.51 kg/m2 32.44 kg/m2    Physical Exam Vitals and nursing note reviewed.  Constitutional:      General: She is not in  acute distress.    Appearance: Normal appearance.  HENT:     Head: Normocephalic and atraumatic.  Cardiovascular:     Rate and Rhythm: Normal rate and regular rhythm.  Pulmonary:     Effort: Pulmonary effort is normal.     Breath sounds: Normal breath sounds. No wheezing, rhonchi or rales.  Neurological:     Mental Status: She is alert.  Psychiatric:        Mood and Affect: Mood normal.        Behavior: Behavior normal.     Lab Results  Component Value Date   WBC 5.7 06/28/2009   HGB 12.7 06/28/2009   HCT 37.6 06/28/2009   PLT 256 06/28/2009     Assessment & Plan:  Parkinson's disease with dyskinesia, unspecified whether manifestations fluctuate (HCC) Assessment & Plan: Stable at this time.  She will continue carbidopa /levodopa  as recommended by Dr. Evonnie.   Primary osteoarthritis of left knee Assessment & Plan: Slowly improving.  Continue meloxicam .  We discussed using for shortest duration possible.   Lichen sclerosus of vulva  Screening for deficiency anemia -     CBC  Hyperlipidemia, unspecified hyperlipidemia type Assessment & Plan: Unsure of control.  Lipid panel today to assess.  Orders: -     Lipid panel  Screening for metabolic disorder -     CMP14+EGFR    Follow-up:  6 months  Tenaya Hilyer Bluford DO [facility]

## 2024-02-10 NOTE — Patient Instructions (Signed)
Labs today.  Follow up in 6 months.  Take care  Dr. Alyxis Grippi  

## 2024-02-10 NOTE — Assessment & Plan Note (Signed)
 Stable at this time.  She will continue carbidopa /levodopa  as recommended by Dr. Evonnie.

## 2024-02-11 ENCOUNTER — Encounter: Payer: Self-pay | Admitting: Family Medicine

## 2024-02-11 LAB — CBC
Hematocrit: 40.8 % (ref 34.0–46.6)
Hemoglobin: 13.7 g/dL (ref 11.1–15.9)
MCH: 30.7 pg (ref 26.6–33.0)
MCHC: 33.6 g/dL (ref 31.5–35.7)
MCV: 92 fL (ref 79–97)
Platelets: 282 x10E3/uL (ref 150–450)
RBC: 4.46 x10E6/uL (ref 3.77–5.28)
RDW: 12 % (ref 11.7–15.4)
WBC: 5.1 x10E3/uL (ref 3.4–10.8)

## 2024-02-11 LAB — CMP14+EGFR
ALT: 5 IU/L (ref 0–32)
AST: 18 IU/L (ref 0–40)
Albumin: 4.4 g/dL (ref 3.9–4.9)
Alkaline Phosphatase: 87 IU/L (ref 49–135)
BUN/Creatinine Ratio: 19 (ref 12–28)
BUN: 18 mg/dL (ref 8–27)
Bilirubin Total: 0.6 mg/dL (ref 0.0–1.2)
CO2: 22 mmol/L (ref 20–29)
Calcium: 9.1 mg/dL (ref 8.7–10.3)
Chloride: 104 mmol/L (ref 96–106)
Creatinine, Ser: 0.94 mg/dL (ref 0.57–1.00)
Globulin, Total: 2.6 g/dL (ref 1.5–4.5)
Glucose: 95 mg/dL (ref 70–99)
Potassium: 4.1 mmol/L (ref 3.5–5.2)
Sodium: 141 mmol/L (ref 134–144)
Total Protein: 7 g/dL (ref 6.0–8.5)
eGFR: 69 mL/min/1.73 (ref >59)

## 2024-02-11 LAB — LIPID PANEL
Chol/HDL Ratio: 5.1 ratio — ABNORMAL HIGH (ref 0.0–4.4)
Cholesterol, Total: 243 mg/dL — ABNORMAL HIGH (ref 100–199)
HDL: 48 mg/dL (ref >39)
LDL Chol Calc (NIH): 176 mg/dL — ABNORMAL HIGH (ref 0–99)
Triglycerides: 109 mg/dL (ref 0–149)
VLDL Cholesterol Cal: 19 mg/dL (ref 5–40)

## 2024-02-13 ENCOUNTER — Ambulatory Visit: Payer: Self-pay | Admitting: Family Medicine

## 2024-02-14 ENCOUNTER — Ambulatory Visit: Admitting: Clinical

## 2024-02-17 ENCOUNTER — Ambulatory Visit: Payer: Self-pay

## 2024-02-17 ENCOUNTER — Encounter: Payer: Self-pay | Admitting: Family Medicine

## 2024-02-17 ENCOUNTER — Ambulatory Visit (INDEPENDENT_AMBULATORY_CARE_PROVIDER_SITE_OTHER): Admitting: Family Medicine

## 2024-02-17 VITALS — BP 151/74 | HR 70 | Ht 65.5 in | Wt 194.0 lb

## 2024-02-17 DIAGNOSIS — H8110 Benign paroxysmal vertigo, unspecified ear: Secondary | ICD-10-CM | POA: Diagnosis not present

## 2024-02-17 MED ORDER — DIAZEPAM 2 MG PO TABS: 2.0000 mg | ORAL_TABLET | Freq: Three times a day (TID) | ORAL | 0 refills | Status: AC | PRN

## 2024-02-17 NOTE — Telephone Encounter (Signed)
 FYI Only or Action Required?: FYI only for provider: appointment scheduled on 02/17/2024.  Patient was last seen in primary care on 02/10/2024 by Cook, Jayce G, DO.  Called Nurse Triage reporting Dizziness.  Symptoms began several days ago.  Interventions attempted: Prescription medications: Meclizine.  Symptoms are: unchanged.  Triage Disposition: See PCP When Office is Open (Within 3 Days)  Patient/caregiver understands and will follow disposition?: Yes      Copied from CRM [id number]. Topic: Clinical - Red Word Triage >> Feb 17, 2024  8:04 AM Tobias L wrote: Red Word that prompted transfer to Nurse Triage: vertigo last night Reason for Disposition  [1] MODERATE dizziness (e.g., vertigo; feels very unsteady, interferes with normal activities) AND [2] has been evaluated by doctor (or NP/PA) for this  Answer Assessment - Initial Assessment Questions 1. DESCRIPTION: Describe your dizziness.     Feels like vertigo when lying down at night 2. VERTIGO: Do you feel like either you or the room is spinning or tilting?      Felt room spinning on Sunday night  3. LIGHTHEADED: Do you feel lightheaded? (e.g., somewhat faint, woozy, weak upon standing)     Denies currently  4. SEVERITY: How bad is it?  Can you walk?     She states symptoms are uncomfortable but are mild  5. ONSET:  When did the dizziness begin?     Sunday night  6. AGGRAVATING FACTORS: Does anything make it worse? (e.g., standing, change in head position)     When sleeping at night  7. OTHER SYMPTOMS: Do you have any other symptoms? (e.g., earache, headache, numbness, tinnitus, vomiting, weakness)     Fluid on her ears    Seen in UC for symptoms and given Meclizine with some improvement. Requesting to see PCP in office for alternative treatment.  Protocols used: Dizziness - Vertigo-A-AH

## 2024-02-17 NOTE — Progress Notes (Signed)
 Subjective:  Patient ID: Martha Phillips, female    DOB: 1960-11-04  Age: 64 y.o. MRN: [id number]  CC:   Chief Complaint  Patient presents with   Dizziness    Off and on dizziness, went to urgent care on Monday     HPI:  63 year old female presents for evaluation of the above.   Developed severe dizziness Sunday night. Rolled over in bed and developed dizziness. Describes it as room spinning. Associated nausea and vomiting. Took Dramamine with improvement. Was seen on UC on 10/27 and was prescribed meclizine. Presents today for repeat evaluation.   Feeling better but still having some dizziness.  Patient Active Problem List   Diagnosis Date Noted   Benign paroxysmal positional vertigo 02/17/2024   Osteoarthritis of left knee 02/10/2024   Lichen sclerosus of vulva 02/10/2024   GAD (generalized anxiety disorder) 02/10/2024   Hyperlipidemia 02/10/2024   Family history of melanoma 06/11/2022   History of dysplastic nevus 06/11/2022   Parkinson's disease (HCC) 11/04/2020    Social Hx   Social History   Socioeconomic History   Marital status: Divorced    Spouse name: Not on file   Number of children: Not on file   Years of education: Not on file   Highest education level: Bachelor's degree (e.g., BA, AB, BS)  Occupational History   Occupation: Event Organiser: [employer]  Tobacco Use   Smoking status: Never   Smokeless tobacco: Never  Vaping Use   Vaping status: Never Used  Substance and Sexual Activity   Alcohol use: No   Drug use: No   Sexual activity: Never    Birth control/protection: Pill    Comment: Loestrin 24 cont.  cycle control  Other Topics Concern   Not on file  Social History Narrative   Right Handed   Lives in a one story home    Social Drivers of Health   Financial Resource Strain: Low Risk  (02/07/2024)   Overall Financial Resource Strain (CARDIA)    Difficulty of Paying Living Expenses: Not hard at all  Food Insecurity: No Food  Insecurity (02/07/2024)   Hunger Vital Sign    Worried About Running Out of Food in the Last Year: Never true    Ran Out of Food in the Last Year: Never true  Transportation Needs: No Transportation Needs (02/07/2024)   PRAPARE - Administrator, Civil Service (Medical): No    Lack of Transportation (Non-Medical): No  Physical Activity: Inactive (02/07/2024)   Exercise Vital Sign    Days of Exercise per Week: 0 days    Minutes of Exercise per Session: Not on file  Stress: No Stress Concern Present (02/07/2024)   [employer] of Occupational Health - Occupational Stress Questionnaire    Feeling of Stress: Only a little  Social Connections: Moderately Integrated (02/07/2024)   Social Connection and Isolation Panel    Frequency of Communication with Friends and Family: More than three times a week    Frequency of Social Gatherings with Friends and Family: Twice a week    Attends Religious Services: More than 4 times per year    Active Member of [employer] or Organizations: Yes    Attends Engineer, Structural: More than 4 times per year    Marital Status: Divorced    Review of Systems Per HPI  Objective:  BP (!) 151/74   Pulse 70   Ht 5' 5.5 (1.664 m)   Wt 194  lb (88 kg)   SpO2 98%   BMI 31.79 kg/m      02/17/2024    3:48 PM 02/10/2024    8:43 AM 11/16/2023    7:53 AM  BP/Weight  Systolic BP 151 136 128  Diastolic BP 74 79 78  Wt. (Lbs) 194 196 198.4  BMI 31.79 kg/m2 31.64 kg/m2 32.51 kg/m2    Physical Exam Vitals and nursing note reviewed.  Constitutional:      General: She is not in acute distress.    Appearance: Normal appearance.  HENT:     Head: Normocephalic and atraumatic.  Pulmonary:     Effort: Pulmonary effort is normal. No respiratory distress.  Neurological:     Mental Status: She is alert. Mental status is at baseline.     Lab Results  Component Value Date   WBC 5.1 02/10/2024   HGB 13.7 02/10/2024   HCT 40.8 02/10/2024    PLT 282 02/10/2024   GLUCOSE 95 02/10/2024   CHOL 243 (H) 02/10/2024   TRIG 109 02/10/2024   HDL 48 02/10/2024   LDLCALC 176 (H) 02/10/2024   ALT 5 02/10/2024   AST 18 02/10/2024   NA 141 02/10/2024   K 4.1 02/10/2024   CL 104 02/10/2024   CREATININE 0.94 02/10/2024   BUN 18 02/10/2024   CO2 22 02/10/2024     Assessment & Plan:  Benign paroxysmal positional vertigo, unspecified laterality Assessment & Plan: Referring to vestibular rehab.  Valium if needed.  Handout given.  Orders: -     Ambulatory referral to Physical Therapy -     diazePAM; Take 1 tablet (2 mg total) by mouth every 8 (eight) hours as needed (Vertigo).  Dispense: 10 tablet; Refill: 0    Follow-up:  Return if symptoms worsen or fail to improve.  Jacqulyn Ahle DO [facility]

## 2024-02-17 NOTE — Assessment & Plan Note (Signed)
 Referring to vestibular rehab.  Valium if needed.  Handout given.

## 2024-02-21 ENCOUNTER — Ambulatory Visit: Admitting: Clinical

## 2024-02-21 ENCOUNTER — Encounter: Payer: Self-pay | Admitting: Physical Therapy

## 2024-02-21 ENCOUNTER — Ambulatory Visit: Attending: Family Medicine | Admitting: Physical Therapy

## 2024-02-21 DIAGNOSIS — H8110 Benign paroxysmal vertigo, unspecified ear: Secondary | ICD-10-CM | POA: Insufficient documentation

## 2024-02-21 DIAGNOSIS — H8112 Benign paroxysmal vertigo, left ear: Secondary | ICD-10-CM | POA: Insufficient documentation

## 2024-02-21 NOTE — Therapy (Signed)
 OUTPATIENT PHYSICAL THERAPY VESTIBULAR EVALUATION     Patient Name: Martha Phillips MRN: [id number] DOB:[birth year], 62 y.o., female Today's Date: 02/21/2024  END OF SESSION:  PT End of Session - 02/21/24 1005     Visit Number 1    Number of Visits 4    Date for Recertification  03/17/24    Authorization Type [employer]    PT Start Time 0920    PT Stop Time 1003    PT Time Calculation (min) 43 min    Activity Tolerance Patient tolerated treatment well    Behavior During Therapy WFL for tasks assessed/performed          Past Medical History:  Diagnosis Date   Allergy    Arthritis    lower back and all toes   Fibroid    Hyperlipidemia    preventative due to family hx   Lower back pain    sciatica   Parkinsons (HCC)    on meds   Past Surgical History:  Procedure Laterality Date   CHOLECYSTECTOMY  1998   COLONOSCOPY     NOVASURE ABLATION  2012   POLYPECTOMY  2015   Uterus with ablation   UTERINE FIBROID SURGERY  2012   2017=submucosoll resected   WISDOM TOOTH EXTRACTION     Patient Active Problem List   Diagnosis Date Noted   Benign paroxysmal positional vertigo 02/17/2024   Osteoarthritis of left knee 02/10/2024   Lichen sclerosus of vulva 02/10/2024   GAD (generalized anxiety disorder) 02/10/2024   Hyperlipidemia 02/10/2024   Family history of melanoma 06/11/2022   History of dysplastic nevus 06/11/2022   Parkinson's disease (HCC) 11/04/2020    PCP: Cook, Jayce G, DO REFERRING PROVIDER: Cook, Jayce G, DO  REFERRING DIAG: H81.10 (ICD-10-CM) - Benign paroxysmal positional vertigo, unspecified laterality  THERAPY DIAG:  BPPV (benign paroxysmal positional vertigo), left  ONSET DATE: 02-13-24  Rationale for Evaluation and Treatment: Rehabilitation  SUBJECTIVE:   SUBJECTIVE STATEMENT: Pt reports episode started last Sunday night (02-13-24); pt states she was turning toward left side in bed and got very dizzy and nausea & vomiting with the episode.  Pt  reports she has had dizziness every day since last Sunday but tolerable; worked from home Thursday and Friday. Had episode this morning - took 1/4 tablet of Dramamine this morning due to having vertigo when she got up and did not want to risk getting sick.  Pt accompanied by: self  PERTINENT HISTORY: Developed severe dizziness Sunday night. Rolled over in bed and developed dizziness. Describes it as room spinning. Associated nausea and vomiting. Took Dramamine with improvement. Was seen on UC on 10/27 and was prescribed meclizine. Presents today for repeat evaluation.   H/o Parkinson's disease. OA Lt knee    PAIN:  Are you having pain? No  PRECAUTIONS: None  RED FLAGS: None   WEIGHT BEARING RESTRICTIONS: No  FALLS: Has patient fallen in last 6 months? No  LIVING ENVIRONMENT: Lives with: lives alone Lives in: House/apartment  PLOF: Independent  PATIENT GOALS: resolve the vertigo  OBJECTIVE:  Note: Objective measures were completed at Evaluation unless otherwise noted.  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   Aggravating factors: Induced by position change: rolling to the left  Relieving factors: head stationary and lying supine  Progression of symptoms: better   POSITIONAL TESTING: Left  Dix-Hallpike: upbeating, left nystagmus  MOTION SENSITIVITY:  Motion Sensitivity Quotient Intensity: 0 = none, 1 = Lightheaded, 2 = Mild, 3 = Moderate, 4 = Severe, 5 = Vomiting  Intensity  1. Sitting to supine   2. Supine to L side   3. Supine to R side   4. Supine to sitting   5. L Hallpike-Dix   6. Up from L    7. R Hallpike-Dix   8. Up from R    9. Sitting, head tipped to L knee   10. Head up from L knee   11. Sitting, head tipped to R knee   12. Head up from R knee   13. Sitting head turns x5   14.Sitting head nods x5   15. In stance, 180 turn to L    16. In stance, 180 turn to R                                                                                                                               TREATMENT DATE: 02-21-24   Canalith Repositioning:  Epley Left: Number of Reps: 4, Response to Treatment: symptoms improved, and Comment: 4th rep was best of the 4 with no nystagmus and no c/o vertigo in any position  PATIENT EDUCATION: Education details: etiology of BPPV with article from [facility] given to pt Person educated: Patient Education method: Explanation, Demonstration, and Handouts Education comprehension: verbalized understanding and returned demonstration  HOME EXERCISE PROGRAM:  to be established   GOALS: Goals reviewed with patient? Yes  SHORT TERM GOALS: Same as LTG's as ELOS = 4 weeks   LONG TERM GOALS: Target date: 03-17-24  Pt will have a (-) Lt Dix-Hallpike test with no nystagmus and no c/o vertigo in test position to indicate resolution of Lt BPPV. Baseline:  Goal status: INITIAL  2.  Pt will perform all bed mobility without any c/o dizziness to indicate resolution of BPPV. Baseline:  Goal status: INITIAL  3.  Pt will verbalize understanding of Epley maneuver and/or Brandt-Daroff exercises for self treatment prn. Baseline:  Goal status: INITIAL   ASSESSMENT:  CLINICAL IMPRESSION: Patient is a 63 y.o. lady who was seen today for  physical therapy evaluation and treatment for Lt BPPV posterior canalithiasis.  Pt had (+) Lt Dix-Hallpike test with Lt rotary upbeating nystagmus, indicative of Lt BPPV.  Pt was treated with Epley maneuver and reported no dizziness in any position of 4th rep.  Pt  did report mild dizziness with rolling from supine to Lt sidelying at end of session.  Pt will benefit from continued PT to address vertigo secondary to Lt BPPV posterior canalithiasis.    OBJECTIVE IMPAIRMENTS: dizziness.   ACTIVITY LIMITATIONS: bending, bed mobility, reach over head, and locomotion level  PARTICIPATION LIMITATIONS: laundry, driving, shopping, community activity, and occupation  PERSONAL FACTORS: N/A are also affecting patient's functional outcome.   REHAB POTENTIAL: Excellent  CLINICAL DECISION MAKING: Stable/uncomplicated  EVALUATION COMPLEXITY: Low   PLAN:  PT FREQUENCY: 1-2x/week  PT DURATION: 4 weeks  PLANNED INTERVENTIONS: 97110-Therapeutic exercises, 97530- Therapeutic activity, [id number]- Neuromuscular re-education, 97535- Self Care, [id number]- Canalith repositioning, and Patient/Family education  PLAN FOR NEXT SESSION: recheck Lt BPPV   Leelynn Whetsel, [place], PT 02/21/2024, 10:07 AM

## 2024-02-22 ENCOUNTER — Ambulatory Visit: Admitting: Physical Therapy

## 2024-02-22 DIAGNOSIS — H8112 Benign paroxysmal vertigo, left ear: Secondary | ICD-10-CM

## 2024-02-22 NOTE — Patient Instructions (Addendum)
 How to Perform the Epley Maneuver The Epley maneuver is an exercise that relieves symptoms of vertigo. Vertigo is the feeling that you or your surroundings are moving when they are not. When you feel vertigo, you may feel like the room is spinning and may have trouble walking. The Epley maneuver is used for a type of vertigo caused by a calcium deposit in a part of the inner ear. The maneuver involves changing head positions to help the deposit move out of the area. You can do this maneuver at home whenever you have symptoms of vertigo. You can repeat it in 24 hours if your vertigo has not gone away. Even though the Epley maneuver may relieve your vertigo for a few weeks, it is possible that your symptoms will return. This maneuver relieves vertigo, but it does not relieve dizziness. What are the risks? If it is done correctly, the Epley maneuver is considered safe. Sometimes it can lead to dizziness or nausea that goes away after a short time. If you develop other symptoms--such as changes in vision, weakness, or numbness--stop doing the maneuver and call your health care provider. Supplies needed: A bed or table. A pillow. How to do the Epley maneuver     Sit on the edge of a bed or table with your back straight and your legs extended or hanging over the edge of the bed or table. Turn your head halfway toward the affected ear or side as told by your health care provider. Lie backward quickly with your head turned until you are lying flat on your back. Your head should dangle (head-hanging position). You may want to position a pillow under your shoulders. Hold this position for at least 30 seconds. If you feel dizzy or have symptoms of vertigo, continue to hold the position until the symptoms stop. Turn your head to the opposite direction until your unaffected ear is facing down. Your head should continue to dangle. Hold this position for at least 30 seconds. If you feel dizzy or have symptoms  of vertigo, continue to hold the position until the symptoms stop. Turn your whole body to the same side as your head so that you are positioned on your side. Your head will now be nearly facedown and no longer needs to dangle. Hold for at least 30 seconds. If you feel dizzy or have symptoms of vertigo, continue to hold the position until the symptoms stop. Sit back up. You can repeat the maneuver in 24 hours if your vertigo does not go away. Follow these instructions at home: For 24 hours after doing the Epley maneuver: Keep your head in an upright position. When lying down to sleep or rest, keep your head raised (elevated) with two or more pillows. Avoid excessive neck movements. Activity Do not drive or use machinery if you feel dizzy. After doing the Epley maneuver, return to your normal activities as told by your health care provider. Ask your health care provider what activities are safe for you. General instructions Drink enough fluid to keep your urine pale yellow. Do not drink alcohol. Take over-the-counter and prescription medicines only as told by your health care provider. Keep all follow-up visits. This is important. Preventing vertigo symptoms Ask your health care provider if there is anything you should do at home to prevent vertigo. He or she may recommend that you: Keep your head elevated with two or more pillows while you sleep. Do not sleep on the side of your affected ear.  Get up slowly from bed. Avoid sudden movements during the day. Avoid extreme head positions or movement, such as looking up or bending over. Contact a health care provider if: Your vertigo gets worse. You have other symptoms, including: Nausea. Vomiting. Headache. Get help right away if you: Have vision changes. Have a headache or neck pain that is severe or getting worse. Cannot stop vomiting. Have new numbness or weakness in any part of your body. These symptoms may represent a serious  problem that is an emergency. Do not wait to see if the symptoms will go away. Get medical help right away. Call your local emergency services (911 in the [place] Do not drive yourself to the hospital. Summary Vertigo is the feeling that you or your surroundings are moving when they are not. The Epley maneuver is an exercise that relieves symptoms of vertigo. If the Epley maneuver is done correctly, it is considered safe. This information is not intended to replace advice given to you by your health care provider. Make sure you discuss any questions you have with your health care provider. Document Revised: 01/01/2023 Document Reviewed: 01/01/2023     Elsevier Patient Education  2024 [employer]Self Treatment for Left Posterior / Anterior Canalithiasis    Sitting on bed: 1. Turn head 45 left. (a) Lie back slowly, shoulders on pillow, head on bed. (b) Hold _20___ seconds. 2. Keeping head on bed, turn head 90 right. Hold __20__ seconds. 3. Roll to right, head on 45 angle down toward bed. Hold _20 ___ seconds. 4. Sit up on right side of bed. Repeat __3__ times per session. Do __2__ sessions per day.    Sit to Side-Lying --- BRANDT-DAROFF exercise    Sit on edge of bed. 1. Turn head 45 to right. 2. Maintain head position and lie down slowly on left side. Hold until symptoms subside. 3. Sit up slowly. Hold until symptoms subside. 4. Turn head 45 to left. 5. Maintain head position and lie down slowly on right side. Hold until symptoms subside. 6. Sit up slowly. Repeat sequence __5__ times per session. Do __3__ sessions per day.  Copyright  VHI. All rights reserved.

## 2024-02-22 NOTE — Therapy (Unsigned)
 OUTPATIENT PHYSICAL THERAPY VESTIBULAR TREATMENT NOTE/DISCHARGE SUMMARY     Patient Name: Martha Phillips MRN: [id number] DOB:07-18-1960, 63 y.o., female Today's Date: 02/23/2024  END OF SESSION:  PT End of Session - 02/23/24 0807     Visit Number 2    Number of Visits 4    Date for Recertification  03/17/24    Authorization Type [employer]    PT Start Time 1314    PT Stop Time 1340    PT Time Calculation (min) 26 min    Activity Tolerance Patient tolerated treatment well    Behavior During Therapy WFL for tasks assessed/performed           Past Medical History:  Diagnosis Date   Allergy    Arthritis    lower back and all toes   Fibroid    Hyperlipidemia    preventative due to family hx   Lower back pain    sciatica   Parkinsons (HCC)    on meds   Past Surgical History:  Procedure Laterality Date   CHOLECYSTECTOMY  1998   COLONOSCOPY     NOVASURE ABLATION  2012   POLYPECTOMY  2015   Uterus with ablation   UTERINE FIBROID SURGERY  2012   2017=submucosoll resected   WISDOM TOOTH EXTRACTION     Patient Active Problem List   Diagnosis Date Noted   Benign paroxysmal positional vertigo 02/17/2024   Osteoarthritis of left knee 02/10/2024   Lichen sclerosus of vulva 02/10/2024   GAD (generalized anxiety disorder) 02/10/2024   Hyperlipidemia 02/10/2024   Family history of melanoma 06/11/2022   History of dysplastic nevus 06/11/2022   Parkinson's disease (HCC) 11/04/2020    PCP: Cook, Jayce G, DO REFERRING PROVIDER: Cook, Jayce G, DO  REFERRING DIAG: H81.10 (ICD-10-CM) - Benign paroxysmal positional vertigo, unspecified laterality  THERAPY DIAG:  BPPV (benign paroxysmal positional vertigo), left  ONSET DATE: 02-13-24  Rationale for Evaluation and Treatment: Rehabilitation  SUBJECTIVE:   SUBJECTIVE STATEMENT: Pt reports she feels much better; felt normal this morning when she got up. Pt states she feels great today - just wants to be sure BPPV has  resolved Pt accompanied by: self  PERTINENT HISTORY: Developed severe dizziness Sunday night. Rolled over in bed and developed dizziness. Describes it as room spinning. Associated nausea and vomiting. Took Dramamine with improvement. Was seen on UC on 10/27 and was prescribed meclizine. Presents today for repeat evaluation.   H/o Parkinson's disease. OA Lt knee    PAIN:  Are you having pain? No  PRECAUTIONS: None  RED FLAGS: None   WEIGHT BEARING RESTRICTIONS: No  FALLS: Has patient fallen in last 6 months? No  LIVING ENVIRONMENT: Lives with: lives alone Lives in: House/apartment  PLOF: Independent  PATIENT GOALS: resolve the vertigo  OBJECTIVE:  Note: Objective measures were completed at Evaluation unless otherwise noted.  DIAGNOSTIC FINDINGS: N/A  Cervical ROM:  WNL's  GAIT: Gait pattern: WFL Distance walked: 95' Assistive device utilized: None Level of assistance: Complete Independence Comments: No unsteadiness noted  VESTIBULAR ASSESSMENT:  GENERAL OBSERVATION: pt reports she had onset of vertigo last Sunday night (02-13-24) when she turned over in bed, onto her left side   SYMPTOM BEHAVIOR:  Subjective history: first vertiginous episode occurred on 02-13-24 (Sunday night) when she turned over in bed onto her left side; did not work Mon-Wed., but did work [employer]. & Friday from home   Non-Vestibular symptoms: nausea/vomiting  Type of dizziness: Spinning/Vertigo  Frequency: daily - depends on  the movement  Duration: secs to mins.   Aggravating factors: Induced by position change: rolling to the left  Relieving factors: head stationary and lying supine  Progression of symptoms: better   POSITIONAL TESTING: Left Dix-Hallpike: upbeating, left nystagmus  MOTION SENSITIVITY:  Motion Sensitivity Quotient Intensity: 0 = none, 1 = Lightheaded, 2 = Mild, 3 = Moderate, 4 = Severe, 5 = Vomiting  Intensity  1. Sitting to supine   2. Supine to L side   3. Supine to  R side   4. Supine to sitting   5. L Hallpike-Dix   6. Up from L    7. R Hallpike-Dix   8. Up from R    9. Sitting, head tipped to L knee   10. Head up from L knee   11. Sitting, head tipped to R knee   12. Head up from R knee   13. Sitting head turns x5   14.Sitting head nods x5   15. In stance, 180 turn to L    16. In stance, 180 turn to R                                                                                                                               TREATMENT DATE: 02-22-24  NeuroRe-ed: Lt Dix-Hallpike test (-) with no nystagmus and no c/o vertigo in test position   Self Care;  reviewed Epley maneuver for self treatment if episode should re-occur in the future; picture of Epley and written instructions on how to perform given to pt from [facility] Also reviewed Brandt-Daroff exercises for another treatment option for BPPV if needed in the future - this exercise also given to patient  Reviewed LTG's and progress - Lt BPPV has resolved as of current time - pt is ready for D/C ------------------------------------ How to Perform the Epley Maneuver The Epley maneuver is an exercise that relieves symptoms of vertigo. Vertigo is the feeling that you or your surroundings are moving when they are not. When you feel vertigo, you may feel like the room is spinning and may have trouble walking. The Epley maneuver is used for a type of vertigo caused by a calcium deposit in a part of the inner ear. The maneuver involves changing head positions to help the deposit move out of the area. You can do this maneuver at home whenever you have symptoms of vertigo. You can repeat it in 24 hours if your vertigo has not gone away. Even though the Epley maneuver may relieve your vertigo for a few weeks, it is possible that your symptoms will return. This maneuver relieves vertigo, but it does not relieve dizziness. What are the risks? If it is done correctly, the Epley maneuver is considered safe.  Sometimes it can lead to dizziness or nausea that goes away after a short time. If you develop other symptoms--such as changes in vision, weakness, or numbness--stop doing the maneuver and call your health  care provider. Supplies needed: A bed or table. A pillow. How to do the Epley maneuver     Sit on the edge of a bed or table with your back straight and your legs extended or hanging over the edge of the bed or table. Turn your head halfway toward the affected ear or side as told by your health care provider. Lie backward quickly with your head turned until you are lying flat on your back. Your head should dangle (head-hanging position). You may want to position a pillow under your shoulders. Hold this position for at least 30 seconds. If you feel dizzy or have symptoms of vertigo, continue to hold the position until the symptoms stop. Turn your head to the opposite direction until your unaffected ear is facing down. Your head should continue to dangle. Hold this position for at least 30 seconds. If you feel dizzy or have symptoms of vertigo, continue to hold the position until the symptoms stop. Turn your whole body to the same side as your head so that you are positioned on your side. Your head will now be nearly facedown and no longer needs to dangle. Hold for at least 30 seconds. If you feel dizzy or have symptoms of vertigo, continue to hold the position until the symptoms stop. Sit back up. You can repeat the maneuver in 24 hours if your vertigo does not go away. Follow these instructions at home: For 24 hours after doing the Epley maneuver: Keep your head in an upright position. When lying down to sleep or rest, keep your head raised (elevated) with two or more pillows. Avoid excessive neck movements. Activity Do not drive or use machinery if you feel dizzy. After doing the Epley maneuver, return to your normal activities as told by your health care provider. Ask your health care  provider what activities are safe for you. General instructions Drink enough fluid to keep your urine pale yellow. Do not drink alcohol. Take over-the-counter and prescription medicines only as told by your health care provider. Keep all follow-up visits. This is important. Preventing vertigo symptoms Ask your health care provider if there is anything you should do at home to prevent vertigo. He or she may recommend that you: Keep your head elevated with two or more pillows while you sleep. Do not sleep on the side of your affected ear. Get up slowly from bed. Avoid sudden movements during the day. Avoid extreme head positions or movement, such as looking up or bending over. Contact a health care provider if: Your vertigo gets worse. You have other symptoms, including: Nausea. Vomiting. Headache. Get help right away if you: Have vision changes. Have a headache or neck pain that is severe or getting worse. Cannot stop vomiting. Have new numbness or weakness in any part of your body. These symptoms may represent a serious problem that is an emergency. Do not wait to see if the symptoms will go away. Get medical help right away. Call your local emergency services (911 in the [place] Do not drive yourself to the hospital. Summary Vertigo is the feeling that you or your surroundings are moving when they are not. The Epley maneuver is an exercise that relieves symptoms of vertigo. If the Epley maneuver is done correctly, it is considered safe. This information is not intended to replace advice given to you by your health care provider. Make sure you discuss any questions you have with your health care provider. Document Revised: 01/01/2023 Document Reviewed: 01/01/2023  Elsevier Patient Education  2024 [employer]Self Treatment for Left Posterior / Anterior Canalithiasis    Sitting on bed: 1. Turn head 45 left. (a) Lie back slowly, shoulders on pillow, head on bed. (b) Hold  _20___ seconds. 2. Keeping head on bed, turn head 90 right. Hold __20__ seconds. 3. Roll to right, head on 45 angle down toward bed. Hold _20 ___ seconds. 4. Sit up on right side of bed. Repeat __3__ times per session. Do __2__ sessions per day.    Sit to Side-Lying --- BRANDT-DAROFF exercise    Sit on edge of bed. 1. Turn head 45 to right. 2. Maintain head position and lie down slowly on left side. Hold until symptoms subside. 3. Sit up slowly. Hold until symptoms subside. 4. Turn head 45 to left. 5. Maintain head position and lie down slowly on right side. Hold until symptoms subside. 6. Sit up slowly. Repeat sequence __5__ times per session. Do __3__ sessions per day.  Copyright  [employer]. All rights reserved.     PATIENT EDUCATION: Education details: see above - Epley maneuver and Brandt-Daroff exercise for self treatment Person educated: Patient Education method: Explanation, Demonstration, and Handouts Education comprehension: verbalized understanding and returned demonstration  HOME EXERCISE PROGRAM:  to be established   GOALS: Goals reviewed with patient? Yes  SHORT TERM GOALS: Same as LTG's as ELOS = 4 weeks   LONG TERM GOALS: Target date: 03-17-24  Pt will have a (-) Lt Dix-Hallpike test with no nystagmus and no c/o vertigo in test position to indicate resolution of Lt BPPV. Baseline:  Goal status: Goal met 02-22-24  2.  Pt will perform all bed mobility without any c/o dizziness to indicate resolution of BPPV. Baseline:  Goal status: Goal met 02-22-24  3.  Pt will verbalize understanding of Epley maneuver and/or Brandt-Daroff exercises for self treatment prn. Baseline:  Goal status: Goal met 02-22-24   ASSESSMENT:  CLINICAL IMPRESSION: All positional testing is now (-) with no nystagmus and no c/o vertigo in Lt Dix-Hallpike test position, indicative of resolution of Lt BPPV. Pt has met 3/3 LTG's and reports no dizziness at this time.  Pt is discharged due to  BPPV now being resolved and all goals met.    OBJECTIVE IMPAIRMENTS: dizziness.   ACTIVITY LIMITATIONS: bending, bed mobility, reach over head, and locomotion level  PARTICIPATION LIMITATIONS: laundry, driving, shopping, community activity, and occupation  PERSONAL FACTORS: N/A are also affecting patient's functional outcome.   REHAB POTENTIAL: Excellent  CLINICAL DECISION MAKING: Stable/uncomplicated  EVALUATION COMPLEXITY: Low   PLAN:  PT FREQUENCY: 1-2x/week  PT DURATION: 4 weeks  PLANNED INTERVENTIONS: 97110-Therapeutic exercises, 97530- Therapeutic activity, 97112- Neuromuscular re-education, 97535- Self Care, 04007- Canalith repositioning, and Patient/Family education  PLAN FOR NEXT SESSION: D/C on 02-22-24    PHYSICAL THERAPY DISCHARGE SUMMARY  Visits from Start of Care: 2  Current functional level related to goals / functional outcomes: See above for progress - Lt BPPV now resolved following Epley maneuver treatment   Remaining deficits: None regarding dizziness/vertigo   Education / Equipment: Pt has been instructed in etiology of BPPV and also in self treatment maneuver of Epley and Brandt-Daroff exercise should she have re-occurrence of BPPV in the future.    Patient agrees to discharge. Patient goals were met. Patient is being discharged due to meeting the stated rehab goals.    Roxanna [place], PT 02/23/2024, 8:09 AM

## 2024-02-23 ENCOUNTER — Encounter: Payer: Self-pay | Admitting: Physical Therapy

## 2024-03-21 ENCOUNTER — Ambulatory Visit (HOSPITAL_COMMUNITY)

## 2024-05-13 ENCOUNTER — Other Ambulatory Visit: Payer: Self-pay | Admitting: Neurology

## 2024-05-25 ENCOUNTER — Encounter: Payer: Self-pay | Admitting: Neurology

## 2024-05-25 ENCOUNTER — Ambulatory Visit (INDEPENDENT_AMBULATORY_CARE_PROVIDER_SITE_OTHER): Admitting: Neurology

## 2024-05-25 VITALS — BP 130/80 | HR 99 | Resp 20 | Ht 66.0 in | Wt 197.0 lb

## 2024-05-25 DIAGNOSIS — G20B1 Parkinson's disease with dyskinesia, without mention of fluctuations: Secondary | ICD-10-CM | POA: Diagnosis not present

## 2024-05-25 DIAGNOSIS — M25562 Pain in left knee: Secondary | ICD-10-CM | POA: Diagnosis not present

## 2024-05-25 DIAGNOSIS — F419 Anxiety disorder, unspecified: Secondary | ICD-10-CM | POA: Diagnosis not present

## 2024-05-25 MED ORDER — AMANTADINE HCL 100 MG PO CAPS: 100.0000 mg | ORAL_CAPSULE | Freq: Two times a day (BID) | ORAL | 1 refills | Status: AC

## 2024-05-25 NOTE — Patient Instructions (Signed)
 Start amantadine  100 mg twice per day  with 2nd and 3rd dosages of levodopa 

## 2024-11-22 ENCOUNTER — Ambulatory Visit: Payer: Self-pay | Admitting: Neurology
